# Patient Record
Sex: Male | Born: 1960 | Race: White | Hispanic: No | Marital: Married | Smoking: Never smoker
Health system: Southern US, Community
[De-identification: ages and names within clinical notes are randomized; demographics above are authoritative.]

## PROBLEM LIST (undated history)

## (undated) DIAGNOSIS — M8580 Other specified disorders of bone density and structure, unspecified site: Secondary | ICD-10-CM

## (undated) DIAGNOSIS — E119 Type 2 diabetes mellitus without complications: Secondary | ICD-10-CM

## (undated) DIAGNOSIS — G4730 Sleep apnea, unspecified: Secondary | ICD-10-CM

## (undated) DIAGNOSIS — G459 Transient cerebral ischemic attack, unspecified: Secondary | ICD-10-CM

## (undated) HISTORY — PX: OTHER SURGICAL HISTORY: SHX169

---

## 2010-07-16 ENCOUNTER — Ambulatory Visit: Payer: Self-pay | Admitting: Family Medicine

## 2012-04-15 ENCOUNTER — Ambulatory Visit: Payer: Self-pay | Admitting: Otolaryngology

## 2013-08-17 ENCOUNTER — Ambulatory Visit: Payer: Self-pay | Admitting: Family Medicine

## 2015-11-05 NOTE — OR Nursing (Signed)
Patient seen for anesthesia consult by Dr Gildardo Griffes on 09/23/15 and [facility] notified today

## 2015-11-09 ENCOUNTER — Ambulatory Visit
Admission: RE | Admit: 2015-11-09 | Discharge: 2015-11-09 | Disposition: A | Discharge status: Home / Self Care | Payer: 59 | Source: Ambulatory Visit | Attending: Gastroenterology | Admitting: Gastroenterology

## 2015-11-09 ENCOUNTER — Ambulatory Visit: Payer: 59 | Admitting: Anesthesiology

## 2015-11-09 ENCOUNTER — Encounter
Admission: RE | Disposition: A | Discharge status: Home / Self Care | Payer: Self-pay | Source: Ambulatory Visit | Attending: Gastroenterology

## 2015-11-09 ENCOUNTER — Encounter: Payer: Self-pay | Admitting: *Deleted

## 2015-11-09 DIAGNOSIS — D122 Benign neoplasm of ascending colon: Secondary | ICD-10-CM | POA: Diagnosis not present

## 2015-11-09 DIAGNOSIS — Z1211 Encounter for screening for malignant neoplasm of colon: Secondary | ICD-10-CM | POA: Diagnosis present

## 2015-11-09 DIAGNOSIS — Z7982 Long term (current) use of aspirin: Secondary | ICD-10-CM | POA: Diagnosis not present

## 2015-11-09 DIAGNOSIS — Z79899 Other long term (current) drug therapy: Secondary | ICD-10-CM | POA: Diagnosis not present

## 2015-11-09 DIAGNOSIS — Z794 Long term (current) use of insulin: Secondary | ICD-10-CM | POA: Insufficient documentation

## 2015-11-09 DIAGNOSIS — E119 Type 2 diabetes mellitus without complications: Secondary | ICD-10-CM | POA: Diagnosis not present

## 2015-11-09 DIAGNOSIS — G4730 Sleep apnea, unspecified: Secondary | ICD-10-CM | POA: Insufficient documentation

## 2015-11-09 DIAGNOSIS — Z8673 Personal history of transient ischemic attack (TIA), and cerebral infarction without residual deficits: Secondary | ICD-10-CM | POA: Insufficient documentation

## 2015-11-09 DIAGNOSIS — Z942 Lung transplant status: Secondary | ICD-10-CM | POA: Diagnosis not present

## 2015-11-09 DIAGNOSIS — K640 First degree hemorrhoids: Secondary | ICD-10-CM | POA: Diagnosis not present

## 2015-11-09 HISTORY — DX: Other specified disorders of bone density and structure, unspecified site: M85.80

## 2015-11-09 HISTORY — DX: Sleep apnea, unspecified: G47.30

## 2015-11-09 HISTORY — DX: Type 2 diabetes mellitus without complications: E11.9

## 2015-11-09 HISTORY — DX: Cystic fibrosis, unspecified: E84.9

## 2015-11-09 HISTORY — PX: COLONOSCOPY: SHX5424

## 2015-11-09 HISTORY — DX: Transient cerebral ischemic attack, unspecified: G45.9

## 2015-11-09 LAB — GLUCOSE, CAPILLARY
GLUCOSE-CAPILLARY: 66 mg/dL (ref 65–99)
GLUCOSE-CAPILLARY: 75 mg/dL (ref 65–99)

## 2015-11-09 SURGERY — COLONOSCOPY
Anesthesia: General

## 2015-11-09 MED ORDER — FENTANYL CITRATE (PF) 100 MCG/2ML IJ SOLN
INTRAMUSCULAR | Status: DC | PRN
  Administered 2015-11-09: 50 ug via INTRAVENOUS

## 2015-11-09 MED ORDER — PROPOFOL 500 MG/50ML IV EMUL
INTRAVENOUS | Status: DC | PRN
  Administered 2015-11-09: 125 ug/kg/min via INTRAVENOUS

## 2015-11-09 MED ORDER — EPHEDRINE SULFATE 50 MG/ML IJ SOLN
INTRAMUSCULAR | Status: DC | PRN
  Administered 2015-11-09 (×2): 5 mg via INTRAVENOUS

## 2015-11-09 MED ORDER — SODIUM CHLORIDE 0.9 % IV SOLN
INTRAVENOUS | Status: DC
  Administered 2015-11-09: 08:00:00 via INTRAVENOUS

## 2015-11-09 MED ORDER — PROPOFOL 10 MG/ML IV BOLUS
INTRAVENOUS | Status: DC | PRN
  Administered 2015-11-09: 50 mg via INTRAVENOUS

## 2015-11-09 NOTE — Transfer of Care (Signed)
Immediate Anesthesia Transfer of Care Note  Patient: Terrence Brown  Procedure(s) Performed: Procedure(s) with comments: COLONOSCOPY (N/A) -  Per office, patient HAS TO be 1st case - IDDM  Patient Location: PACU  Anesthesia Type:General  Level of Consciousness: awake, alert , oriented and patient cooperative  Airway & Oxygen Therapy: Patient Spontanous Breathing  Post-op Assessment: Report given to RN, Post -op Vital signs reviewed and stable and Patient moving all extremities  Post vital signs: Reviewed and stable  Last Vitals:  Filed Vitals:   11/09/15 0844 11/09/15 0848  BP: 108/47 108/47  Pulse: 87 82  Temp: 36.1 C 35.7 C  Resp: 18 15    Complications: No apparent anesthesia complications

## 2015-11-09 NOTE — Op Note (Signed)
[facility] Patient Name: Terrence Brown Procedure Date: 11/09/2015 8:11 AM MRN: [id number] Account #: [id number] Date of Birth: 08/22/1961 Admit Type: Outpatient Age: 54 Room: [facility] ENDO ROOM 3 Gender: Male Note Status: Finalized Procedure:         Colonoscopy Indications:       Screening for colorectal malignant neoplasm, Last                     colonoscopy: 2010 Patient Profile:   This is a 54 year old male. Providers:         Gerrit Heck. Rayann Heman, MD Referring MD:      Otho Bellows. Frederico Hamman, MD (Referring MD) Medicines:         Propofol per Anesthesia Complications:     No immediate complications. Procedure:         Pre-Anesthesia Assessment:                    - Prior to the procedure, a History and Physical was                     performed, and patient medications, allergies and                     sensitivities were reviewed. The patient's tolerance of                     previous anesthesia was reviewed.                    After obtaining informed consent, the colonoscope was                     passed under direct vision. Throughout the procedure, the                     patient's blood pressure, pulse, and oxygen saturations                     were monitored continuously. The Colonoscope was                     introduced through the anus and advanced to the the cecum,                     identified by appendiceal orifice and ileocecal valve. The                     colonoscopy was performed without difficulty. The patient                     tolerated the procedure well. The quality of the bowel                     preparation was good. Findings:      The perianal and digital rectal examinations were normal.      A 3 mm polyp was found in the ascending colon. The polyp was sessile.       The polyp was removed with a jumbo cold forceps. Resection and retrieval       were complete.      Internal hemorrhoids were found during retroflexion.  The hemorrhoids       were Grade I (internal hemorrhoids that do not prolapse).      The exam was otherwise without abnormality.  Impression:        - One 3 mm polyp in the ascending colon. Resected and                     retrieved.                    - Internal hemorrhoids.                    - The examination was otherwise normal. Recommendation:    - Observe patient in GI recovery unit.                    - High fiber diet.                    - Continue present medications.                    - Await pathology results.                    - Repeat colonoscopy for surveillance based on pathology                     results.                    - Return to referring physician.                    - The findings and recommendations were discussed with the                     patient.                    - The findings and recommendations were discussed with the                     patient's family. Procedure Code(s): --- Professional ---                    [phone], Colonoscopy, flexible; with biopsy, single or                     multiple Diagnosis Code(s): --- Professional ---                    Z12.11, Encounter for screening for malignant neoplasm of                     colon                    D12.2, Benign neoplasm of ascending colon                    K64.0, First degree hemorrhoids CPT copyright 2014 [employer] Association. All rights reserved. The codes documented in this report are preliminary and upon coder review may  be revised to meet current compliance requirements. Mellody Life, MD 11/09/2015 8:43:32 AM This report has been signed electronically. Number of Addenda: 0 Note Initiated On: 11/09/2015 8:11 AM Scope Withdrawal Time: 0 hours 18 minutes 16 seconds  Total Procedure Duration: 0 hours 23 minutes 47 seconds       [facility]

## 2015-11-09 NOTE — Discharge Instructions (Signed)

## 2015-11-09 NOTE — Anesthesia Postprocedure Evaluation (Signed)
Anesthesia Post Note  Patient: Terrence Brown  Procedure(s) Performed: Procedure(s) (LRB): COLONOSCOPY (N/A)  Patient location during evaluation: Endoscopy Anesthesia Type: General Level of consciousness: awake and alert Pain management: pain level controlled Vital Signs Assessment: post-procedure vital signs reviewed and stable Respiratory status: spontaneous breathing, nonlabored ventilation, respiratory function stable and patient connected to nasal cannula oxygen Cardiovascular status: blood pressure returned to baseline and stable Postop Assessment: no signs of nausea or vomiting Anesthetic complications: no    Last Vitals:  Filed Vitals:   11/09/15 0900 11/09/15 0914  BP: 115/77 111/62  Pulse: 80 77  Temp:    Resp: 15 14    Last Pain: There were no vitals filed for this visit.               Precious Haws [place]

## 2015-11-09 NOTE — H&P (Signed)
Primary Care Physician:  Laurell Josephs, MD  Pre-Procedure History & Physical: HPI:  Terrence Brown is a 54 y.o. male is here for an colonoscopy.   Past Medical History  Diagnosis Date  . Diabetes mellitus without complication ([place])   . Cystic fibrosis ([place])   . Osteopenia   . Sleep apnea   . TIA (transient ischemic attack)     Past Surgical History  Procedure Laterality Date  . Lung transplant Bilateral   . Knee arthoscopy Right     Prior to Admission medications   Medication Sig Start Date End Date Taking? Authorizing Provider  alendronate (FOSAMAX) 70 MG tablet Take 70 mg by mouth once a week. Take with a full glass of water on an empty stomach.   Yes Historical Provider, MD  aspirin EC 81 MG tablet Take 81 mg by mouth daily.   Yes Historical Provider, MD  atenolol (TENORMIN) 25 MG tablet Take 25 mg by mouth daily.   Yes Historical Provider, MD  azaTHIOprine (IMURAN) 50 MG tablet Take 50 mg by mouth daily.   Yes Historical Provider, MD  calcium carbonate (TUMS - DOSED IN MG ELEMENTAL CALCIUM) 500 MG chewable tablet Chew 1 tablet by mouth daily.   Yes Historical Provider, MD  ferrous sulfate 325 (65 FE) MG tablet Take 325 mg by mouth daily with breakfast.   Yes Historical Provider, MD  insulin aspart (NOVOLOG) 100 UNIT/ML injection Inject into the skin 3 (three) times daily before meals.   Yes Historical Provider, MD  insulin lispro (HUMALOG) 100 UNIT/ML injection Inject into the skin 3 (three) times daily before meals.   Yes Historical Provider, MD  insulin NPH Human (HUMULIN N,NOVOLIN N) 100 UNIT/ML injection Inject 40 Units into the skin daily before breakfast.   Yes Historical Provider, MD  insulin regular (NOVOLIN R,HUMULIN R) 100 units/mL injection Inject 0-10 Units into the skin 3 (three) times daily before meals.   Yes Historical Provider, MD  lipase/protease/amylase (CREON) 12000 UNITS CPEP capsule Take 12,000 Units by mouth.   Yes Historical Provider, MD  magnesium  oxide (MAG-OX) 400 MG tablet Take 400 mg by mouth daily.   Yes Historical Provider, MD  Multiple Vitamin (MULTIVITAMIN) capsule Take 1 capsule by mouth daily.   Yes Historical Provider, MD  nystatin (MYCOSTATIN) 100000 UNITS/ML SUSP Take 0.5 mLs by mouth every 6 (six) hours.   Yes Historical Provider, MD  predniSONE (DELTASONE) 10 MG tablet Take 10 mg by mouth daily with breakfast.   Yes Historical Provider, MD  tacrolimus (PROGRAF) 1 MG capsule Take 1 mg by mouth 2 (two) times daily.   Yes Historical Provider, MD  traMADol (ULTRAM) 50 MG tablet Take 50 mg by mouth every 6 (six) hours as needed.   Yes Historical Provider, MD  tretinoin (RETIN-A) 0.01 % gel Apply topically at bedtime.   Yes Historical Provider, MD  zolpidem (AMBIEN) 5 MG tablet Take 5 mg by mouth at bedtime as needed for sleep.   Yes Historical Provider, MD    Allergies as of 09/21/2015  . (Not on File)    History reviewed. No pertinent family history.  Social History   Social History  . Marital Status: Married    Spouse Name: N/A  . Number of Children: N/A  . Years of Education: N/A   Occupational History  . Not on file.   Social History Main Topics  . Smoking status: Never Smoker   . Smokeless tobacco: Never Used  . Alcohol Use: No  .  Drug Use: No  . Sexual Activity: Not on file   Other Topics Concern  . Not on file   Social History Narrative     Physical Exam: BP 137/83 mmHg  Pulse 75  Temp(Src) 97.8 F (36.6 C) (Tympanic)  Resp 14  Ht 5\' 7"  (1.702 m)  Wt 72.576 kg (160 lb)  BMI 25.05 kg/m2  SpO2 100% General:   Alert,  pleasant and cooperative in NAD Head:  Normocephalic and atraumatic. Neck:  Supple; no masses or thyromegaly. Lungs:  Clear throughout to auscultation.    Heart:  Regular rate and rhythm. Abdomen:  Soft, nontender and nondistended. Normal bowel sounds, without guarding, and without rebound.   Neurologic:  Alert and  oriented x4;  grossly normal  neurologically.  Impression/Plan: NYAIRE AMJAD is here for an colonoscopy to be performed for screening  Risks, benefits, limitations, and alternatives regarding  colonoscopy have been reviewed with the patient.  Questions have been answered.  All parties agreeable.   Josefine Class, MD  11/09/2015, 8:10 AM

## 2015-11-09 NOTE — Anesthesia Preprocedure Evaluation (Signed)
Anesthesia Evaluation  Patient identified by MRN, date of birth, ID band Patient awake    Reviewed: Allergy & Precautions, H&P , NPO status , Patient's Chart, lab work & pertinent test results  Airway Mallampati: II  TM Distance: >3 FB Neck ROM: full    Dental no notable dental hx. (+) Teeth Intact   Pulmonary sleep apnea ,    Pulmonary exam normal breath sounds clear to auscultation       Cardiovascular Exercise Tolerance: Good Normal cardiovascular exam Rhythm:regular Rate:Normal     Neuro/Psych TIACVA, Residual Symptoms negative psych ROS   GI/Hepatic negative GI ROS, Neg liver ROS,   Endo/Other  diabetes, Well Controlled, Type 2, Insulin Dependent  Renal/GU negative Renal ROS  negative genitourinary   Musculoskeletal   Abdominal   Peds  Hematology negative hematology ROS (+)   Anesthesia Other Findings Past Medical History:   Diabetes mellitus without complication (HCC)                 Cystic fibrosis (HCC)                                        Osteopenia                                                   Sleep apnea                                                  TIA (transient ischemic attack)                             Past Surgical History:   lung transplant                                 Bilateral              knee arthoscopy                                 Right             BMI    Body Mass Index   25.05 kg/m 2      Reproductive/Obstetrics negative OB ROS                             Anesthesia Physical Anesthesia Plan  ASA: IV  Anesthesia Plan: General   Post-op Pain Management:    Induction:   Airway Management Planned:   Additional Equipment:   Intra-op Plan:   Post-operative Plan:   Informed Consent: I have reviewed the patients History and Physical, chart, labs and discussed the procedure including the risks, benefits and alternatives for the  proposed anesthesia with the patient or authorized representative who has indicated his/her understanding and acceptance.   Dental Advisory Given  Plan Discussed with: Anesthesiologist, CRNA and Surgeon  Anesthesia Plan Comments:  Anesthesia Quick Evaluation  

## 2015-11-10 ENCOUNTER — Encounter: Payer: Self-pay | Admitting: Gastroenterology

## 2015-11-12 LAB — SURGICAL PATHOLOGY

## 2016-08-08 ENCOUNTER — Emergency Department
Admission: EM | Admit: 2016-08-08 | Discharge: 2016-08-08 | Disposition: A | Discharge status: Home / Self Care | Payer: 59 | Attending: Emergency Medicine | Admitting: Emergency Medicine

## 2016-08-08 DIAGNOSIS — Y92000 Kitchen of unspecified non-institutional (private) residence as  the place of occurrence of the external cause: Secondary | ICD-10-CM | POA: Insufficient documentation

## 2016-08-08 DIAGNOSIS — Z794 Long term (current) use of insulin: Secondary | ICD-10-CM | POA: Diagnosis not present

## 2016-08-08 DIAGNOSIS — Z7982 Long term (current) use of aspirin: Secondary | ICD-10-CM | POA: Insufficient documentation

## 2016-08-08 DIAGNOSIS — E119 Type 2 diabetes mellitus without complications: Secondary | ICD-10-CM | POA: Diagnosis not present

## 2016-08-08 DIAGNOSIS — Z23 Encounter for immunization: Secondary | ICD-10-CM | POA: Diagnosis not present

## 2016-08-08 DIAGNOSIS — Y999 Unspecified external cause status: Secondary | ICD-10-CM | POA: Diagnosis not present

## 2016-08-08 DIAGNOSIS — W01190A Fall on same level from slipping, tripping and stumbling with subsequent striking against furniture, initial encounter: Secondary | ICD-10-CM | POA: Diagnosis not present

## 2016-08-08 DIAGNOSIS — S01411A Laceration without foreign body of right cheek and temporomandibular area, initial encounter: Secondary | ICD-10-CM | POA: Insufficient documentation

## 2016-08-08 DIAGNOSIS — Y939 Activity, unspecified: Secondary | ICD-10-CM | POA: Insufficient documentation

## 2016-08-08 DIAGNOSIS — S0181XA Laceration without foreign body of other part of head, initial encounter: Secondary | ICD-10-CM

## 2016-08-08 MED ORDER — LIDOCAINE-EPINEPHRINE (PF) 1 %-1:200000 IJ SOLN
10.0000 mL | Freq: Once | INTRAMUSCULAR | Status: DC
  Filled 2016-08-08: qty 30

## 2016-08-08 MED ORDER — TETANUS-DIPHTH-ACELL PERTUSSIS 5-2.5-18.5 LF-MCG/0.5 IM SUSP
0.5000 mL | Freq: Once | INTRAMUSCULAR | Status: AC
  Administered 2016-08-08: 0.5 mL via INTRAMUSCULAR

## 2016-08-08 MED ORDER — CEPHALEXIN 500 MG PO CAPS: 500.0000 mg | ORAL_CAPSULE | Freq: Three times a day (TID) | ORAL | 0 refills | Status: DC

## 2016-08-08 NOTE — ED Triage Notes (Signed)
Pt reports to ED w/ c/o mechanical fall.  Pt denies LOC or dizziness.  Pt sts he hit side of face (r) on table. Pt has 1" lac to R cheekbone.  Bleeding controlled, edges of wound approximate.  Pt alert, oriented, NAD.

## 2016-08-08 NOTE — ED Provider Notes (Signed)
[facility] Emergency Department Provider Note  ____________________________________________  Time seen: Approximately 10:49 PM  I have reviewed the triage vital signs and the nursing notes.   HISTORY  Chief Complaint Facial Laceration    HPI Terrence Brown is a 55 y.o. male , NAD, presents to the emergency department with laceration to the right cheek. States he was at home, slipped in his kitchen and hit his right cheek on the edge of a counter. States the fall was mechanical and denies any changes in vision, chest pain, shortness breath, LOC or dizziness. He states he is uncertain of last tetanus vaccine.   Past Medical History:  Diagnosis Date  . Cystic fibrosis ([place])   . Diabetes mellitus without complication ([place])   . Osteopenia   . Sleep apnea   . TIA (transient ischemic attack)     There are no active problems to display for this patient.   Past Surgical History:  Procedure Laterality Date  . COLONOSCOPY N/A 11/09/2015   Procedure: COLONOSCOPY;  Surgeon: Josefine Class, MD;  Location: [facility] ENDOSCOPY;  Service: Endoscopy;  Laterality: N/A;   Per office, patient HAS TO be 1st case - IDDM  . knee arthoscopy Right   . lung transplant Bilateral     Prior to Admission medications   Medication Sig Start Date End Date Taking? Authorizing Provider  alendronate (FOSAMAX) 70 MG tablet Take 70 mg by mouth once a week. Take with a full glass of water on an empty stomach.    Historical Provider, MD  aspirin EC 81 MG tablet Take 81 mg by mouth daily.    Historical Provider, MD  atenolol (TENORMIN) 25 MG tablet Take 25 mg by mouth daily.    Historical Provider, MD  azaTHIOprine (IMURAN) 50 MG tablet Take 50 mg by mouth daily.    Historical Provider, MD  calcium carbonate (TUMS - DOSED IN MG ELEMENTAL CALCIUM) 500 MG chewable tablet Chew 1 tablet by mouth daily.    Historical Provider, MD  cephALEXin (KEFLEX) 500 MG capsule Take 1 capsule (500 mg  total) by mouth 3 (three) times daily. 08/08/16   Ladeana Laplant L Jaken Fregia, PA-C  ferrous sulfate 325 (65 FE) MG tablet Take 325 mg by mouth daily with breakfast.    Historical Provider, MD  insulin aspart (NOVOLOG) 100 UNIT/ML injection Inject into the skin 3 (three) times daily before meals.    Historical Provider, MD  insulin lispro (HUMALOG) 100 UNIT/ML injection Inject into the skin 3 (three) times daily before meals.    Historical Provider, MD  insulin NPH Human (HUMULIN N,NOVOLIN N) 100 UNIT/ML injection Inject 40 Units into the skin daily before breakfast.    Historical Provider, MD  insulin regular (NOVOLIN R,HUMULIN R) 100 units/mL injection Inject 0-10 Units into the skin 3 (three) times daily before meals.    Historical Provider, MD  lipase/protease/amylase (CREON) 12000 UNITS CPEP capsule Take 12,000 Units by mouth.    Historical Provider, MD  magnesium oxide (MAG-OX) 400 MG tablet Take 400 mg by mouth daily.    Historical Provider, MD  Multiple Vitamin (MULTIVITAMIN) capsule Take 1 capsule by mouth daily.    Historical Provider, MD  nystatin (MYCOSTATIN) 100000 UNITS/ML SUSP Take 0.5 mLs by mouth every 6 (six) hours.    Historical Provider, MD  predniSONE (DELTASONE) 10 MG tablet Take 10 mg by mouth daily with breakfast.    Historical Provider, MD  tacrolimus (PROGRAF) 1 MG capsule Take 1 mg by mouth 2 (two)  times daily.    Historical Provider, MD  traMADol (ULTRAM) 50 MG tablet Take 50 mg by mouth every 6 (six) hours as needed.    Historical Provider, MD  tretinoin (RETIN-A) 0.01 % gel Apply topically at bedtime.    Historical Provider, MD  zolpidem (AMBIEN) 5 MG tablet Take 5 mg by mouth at bedtime as needed for sleep.    Historical Provider, MD    Allergies Percocet [oxycodone-acetaminophen] and Sulfa antibiotics  No family history on file.  Social History Social History  Substance Use Topics  . Smoking status: Never Smoker  . Smokeless tobacco: Never Used  . Alcohol use No      Review of Systems  Constitutional: No fever/chills Eyes: No visual changes.  Cardiovascular: No chest pain. Respiratory: No shortness of breath. No wheezing.  Gastrointestinal: No abdominal pain.  No nausea, vomiting.. Musculoskeletal: Negative for Neck pain.  Skin: Positive laceration right cheek. No bruising or swelling. Neurological: Negative for headaches, focal weakness or numbness. No tingling, LOC, dizziness. 10-point ROS otherwise negative.  ____________________________________________   PHYSICAL EXAM:  VITAL SIGNS: ED Triage Vitals  Enc Vitals Group     BP 08/08/16 2159 (!) 145/71     Pulse Rate 08/08/16 2159 70     Resp 08/08/16 2159 20     Temp 08/08/16 2159 98.5 F (36.9 C)     Temp Source 08/08/16 2159 Oral     SpO2 08/08/16 2159 97 %     Weight 08/08/16 2159 160 lb (72.6 kg)     Height 08/08/16 2159 5\' 6"  (1.676 m)     Head Circumference --      Peak Flow --      Pain Score 08/08/16 2201 5     Pain Loc --      Pain Edu? --      Excl. in [place]? --      Constitutional: Alert and oriented. Well appearing and in no acute distress. Eyes: Conjunctivae are normal. PERRLA. EOMI without pain.  Head: Normocephalic. Tenderness to palpation about the right cheek. Neck: Supple with full range of motion Hematological/Lymphatic/Immunilogical: No cervical lymphadenopathy. Cardiovascular:  Good peripheral circulation. Respiratory: Normal respiratory effort without tachypnea or retractions.  Neurologic:  Normal speech and language. No gross focal neurologic deficits are appreciated.  Skin:  2.5cm linear laceration noted to the right cheek with bleeding controlled. No surrounding bruising or swelling. Skin is warm, dry. No rash noted. Psychiatric: Mood and affect are normal. Speech and behavior are normal. Patient exhibits appropriate insight and judgement.   ____________________________________________    LABS  None ____________________________________________  EKG  None ____________________________________________  RADIOLOGY  None ____________________________________________    PROCEDURES  Procedure(s) performed: None   .Marland KitchenLaceration Repair Date/Time: 08/08/2016 11:20 PM Performed by: Natale Milch L Authorized by: Braxton Feathers   Consent:    Consent obtained:  Verbal   Consent given by:  Patient   Risks discussed:  Infection, pain and poor cosmetic result   Alternatives discussed:  No treatment Anesthesia (see MAR for exact dosages):    Anesthesia method:  Local infiltration   Local anesthetic:  Lidocaine 1% WITH epi Laceration details:    Location:  Face   Face location:  R cheek   Length (cm):  2.5   Depth (mm):  3 Repair type:    Repair type:  Simple Pre-procedure details:    Preparation:  Patient was prepped and draped in usual sterile fashion Exploration:    Hemostasis achieved with:  Direct pressure   Wound exploration: wound explored through full range of motion and entire depth of wound probed and visualized     Wound extent: no foreign bodies/material noted and no muscle damage noted     Contaminated: no   Treatment:    Area cleansed with:  Betadine   Amount of cleaning:  Standard   Irrigation solution:  Sterile saline   Irrigation volume:  100   Irrigation method:  Syringe   Foreign body removal: No foreign bodies.   Skin repair:    Repair method:  Sutures   Suture size:  5-0   Suture material:  Nylon   Suture technique:  Simple interrupted   Number of sutures:  6 Approximation:    Approximation:  Close   [place] border: well-aligned   Post-procedure details:    Dressing:  Non-adherent dressing   Patient tolerance of procedure:  Tolerated well, no immediate complications      Medications  lidocaine-EPINEPHrine (XYLOCAINE-EPINEPHrine) 1 %-1:200000 (PF) injection 10 mL (not administered)  Tdap (BOOSTRIX) injection 0.5 mL (0.5 mLs  Intramuscular Given 08/08/16 2303)     ____________________________________________   INITIAL IMPRESSION / ASSESSMENT AND PLAN / ED COURSE  Pertinent labs & imaging results that were available during my care of the patient were reviewed by me and considered in my medical decision making (see chart for details).  Clinical Course    Patient's diagnosis is consistent with Facial laceration and need for tetanus due to fall. Patient will be discharged home with prescriptions for Keflex to take as directed. Patient is to keep wound clean and dry over the next 48 hours and then may cleanse per usual regimen. Patient is to follow up with his primary care provider in 2-3 days for wound recheck.  Patient is to follow-up with his primary care provider or an outpatient urgent care clinic in 5-7 days for suture removal. Patient is given ED precautions to return to the ED for any worsening or new symptoms.    ____________________________________________  FINAL CLINICAL IMPRESSION(S) / ED DIAGNOSES  Final diagnoses:  Facial laceration, initial encounter  Need for tetanus booster      NEW MEDICATIONS STARTED DURING THIS VISIT:  New Prescriptions   CEPHALEXIN (KEFLEX) 500 MG CAPSULE    Take 1 capsule (500 mg total) by mouth 3 (three) times daily.         Braxton Feathers, PA-C 08/08/16 2344    Lisa Roca, MD 08/09/16 0010

## 2017-01-27 DIAGNOSIS — Z942 Lung transplant status: Secondary | ICD-10-CM | POA: Diagnosis not present

## 2017-01-27 DIAGNOSIS — G4733 Obstructive sleep apnea (adult) (pediatric): Secondary | ICD-10-CM | POA: Diagnosis not present

## 2017-02-03 DIAGNOSIS — G4733 Obstructive sleep apnea (adult) (pediatric): Secondary | ICD-10-CM | POA: Diagnosis not present

## 2017-02-23 ENCOUNTER — Encounter: Payer: 59 | Attending: Specialist | Admitting: Respiratory Therapy

## 2017-02-24 NOTE — Patient Instructions (Signed)
Patient Instructions  Patient Details  Name: Terrence MCCLANAHAN MRN: [id number] Date of Birth: Jan 31, 1961 Referring Provider:  Erby Pian, MD  Below are the personal goals you chose as well as exercise and nutrition goals. Our goal is to help you keep on track towards obtaining and maintaining your goals. We will be discussing your progress on these goals with you throughout the program.  Initial Exercise Prescription:     Initial Exercise Prescription - 02/23/17 1600      Date of Initial Exercise RX and Referring Provider   Date 02/23/17   Referring Provider Raul Del     Treadmill   Minutes 15     Recumbant Bike   Level 1   RPM 50   Minutes 15     NuStep   Level 3   Minutes 15     Prescription Details   Frequency (times per week) 3   Duration Progress to 30 minutes of continuous aerobic without signs/symptoms of physical distress     Intensity   THRR 40-80% of Max Heartrate 104-145   Ratings of Perceived Exertion 11-15   Perceived Dyspnea 0-4     Resistance Training   Training Prescription Yes   Weight 3   Reps 10-15      Exercise Goals: Frequency: Be able to perform aerobic exercise three times per week working toward 3-5 days per week.  Intensity: Work with a perceived exertion of 11 (fairly light) - 15 (hard) as tolerated. Follow your new exercise prescription and watch for changes in prescription as you progress with the program. Changes will be reviewed with you when they are made.  Duration: You should be able to do 30 minutes of continuous aerobic exercise in addition to a 5 minute warm-up and a 5 minute cool-down routine.  Nutrition Goals: Your personal nutrition goals will be established when you do your nutrition analysis with the dietician.  The following are nutrition guidelines to follow: Cholesterol < 200mg /day Sodium < 1500mg /day Fiber: Men over 50 yrs - 30 grams per day  Personal Goals:     Personal Goals and Risk Factors at  Admission - 02/23/17 1545      Core Components/Risk Factors/Patient Goals on Admission    Weight Management Yes   Intervention --  Wife cooks very healthy - lots of salads and vegetables   Admit Weight 171 lb 3.2 oz (77.7 kg)   Goal Weight: Short Term 166 lb (75.3 kg)   Goal Weight: Long Term 140 lb (63.5 kg)   Expected Outcomes Short Term: Continue to assess and modify interventions until short term weight is achieved;Long Term: Adherence to nutrition and physical activity/exercise program aimed toward attainment of established weight goal;Weight Maintenance: Understanding of the daily nutrition guidelines, which includes 25-35% calories from fat, 7% or less cal from saturated fats, less than 200mg  cholesterol, less than 1.5gm of sodium, & 5 or more servings of fruits and vegetables daily;Weight Loss: Understanding of general recommendations for a balanced deficit meal plan, which promotes 1-2 lb weight loss per week and includes a negative energy balance of [phone] kcal/d;Understanding recommendations for meals to include 15-35% energy as protein, 25-35% energy from fat, 35-60% energy from carbohydrates, less than 200mg  of dietary cholesterol, 20-35 gm of total fiber daily   Improve shortness of breath with ADL's Yes   Intervention Provide education, individualized exercise plan and daily activity instruction to help decrease symptoms of SOB with activities of daily living.   Expected Outcomes Short Term: Achieves  a reduction of symptoms when performing activities of daily living.   Develop more efficient breathing techniques such as purse lipped breathing and diaphragmatic breathing; and practicing self-pacing with activity Yes   Intervention Provide education, demonstration and support about specific breathing techniuqes utilized for more efficient breathing. Include techniques such as pursed lipped breathing, diaphragmatic breathing and self-pacing activity.   Expected Outcomes Short Term:  Participant will be able to demonstrate and use breathing techniques as needed throughout daily activities.   Increase knowledge of respiratory medications and ability to use respiratory devices properly  Yes   Intervention Provide education and demonstration as needed of appropriate use of medications, inhalers, and oxygen therapy.  Good understanding of CPAP; Uses suto settings.    Expected Outcomes Short Term: Achieves understanding of medications use. Understands that oxygen is a medication prescribed by physician. Demonstrates appropriate use of inhaler and oxygen therapy.   Lipids Yes   Intervention Provide education and support for participant on nutrition & aerobic/resistive exercise along with prescribed medications to achieve LDL 70mg , HDL >40mg .   Expected Outcomes Short Term: Participant states understanding of desired cholesterol values and is compliant with medications prescribed. Participant is following exercise prescription and nutrition guidelines.;Long Term: Cholesterol controlled with medications as prescribed, with individualized exercise RX and with personalized nutrition plan. Value goals: LDL < 70mg , HDL > 40 mg.      Tobacco Use Initial Evaluation: History  Smoking Status   Never Smoker  Smokeless Tobacco   Never Used    Copy of goals given to participant.

## 2017-02-24 NOTE — Progress Notes (Signed)
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 Take with a full glass of water on an empty stomach., Disp: , Rfl:    aspirin EC 81 MG tablet, Take 81 mg by mouth daily., Disp: , Rfl:    atenolol (TENORMIN) 25 MG tablet, Take 25 mg by mouth daily., Disp: , Rfl:    azaTHIOprine (IMURAN) 50 MG tablet, Take 50 mg by mouth daily., Disp: , Rfl:    calcium carbonate (TUMS - DOSED IN MG ELEMENTAL CALCIUM) 500 MG chewable tablet, Chew 1 tablet by mouth daily., Disp: , Rfl:    cephALEXin (KEFLEX) 500 MG capsule, Take 1 capsule (500 mg total) by mouth 3 (three) times daily., Disp: 21 capsule, Rfl: 0   ferrous sulfate 325 (65 FE) MG tablet, Take 325 mg by mouth daily with breakfast., Disp: , Rfl:    insulin aspart (NOVOLOG) 100 UNIT/ML injection, Inject into the skin 3 (three) times daily before meals., Disp: , Rfl:    insulin lispro (HUMALOG) 100 UNIT/ML injection, Inject into the skin 3 (three) times daily before meals., Disp: , Rfl:    insulin NPH Human (HUMULIN N,NOVOLIN N) 100 UNIT/ML injection, Inject 40 Units into the skin daily before breakfast., Disp: , Rfl:    insulin regular (NOVOLIN R,HUMULIN R) 100 units/mL injection, Inject 0-10 Units into the skin 3 (three) times daily before meals., Disp: , Rfl:    lipase/protease/amylase (CREON) 12000 UNITS CPEP capsule, Take 12,000 Units by mouth., Disp: , Rfl:    magnesium oxide (MAG-OX) 400 MG tablet, Take  400 mg by mouth daily., Disp: , Rfl:    Multiple Vitamin (MULTIVITAMIN) capsule, Take 1 capsule by mouth daily., Disp: , Rfl:    nystatin (MYCOSTATIN) 100000 UNITS/ML SUSP, Take 0.5 mLs by mouth every 6 (six) hours., Disp: , Rfl:    predniSONE (DELTASONE) 10 MG tablet, Take 10 mg by mouth daily with breakfast., Disp: , Rfl:    tacrolimus (PROGRAF) 1 MG capsule, Take 1 mg by mouth 2 (two) times daily., Disp: , Rfl:    traMADol (ULTRAM) 50 MG tablet, Take 50 mg by mouth every 6 (six) hours as needed., Disp: , Rfl:    tretinoin (RETIN-A) 0.01 % gel, Apply topically at bedtime., Disp: , Rfl:    zolpidem (AMBIEN) 5 MG tablet, Take 5 mg by mouth at bedtime as needed for sleep., Disp: , Rfl:   Past Medical History: Past Medical History:  Diagnosis Date   Cystic fibrosis ([place])    Diabetes mellitus without complication ([place])    Osteopenia    Sleep apnea    TIA (transient ischemic attack)     Tobacco Use: History  Smoking Status   Never Smoker  Smokeless Tobacco   Never Used    Labs: Recent Review Flowsheet Data    There is no flowsheet data to display.       ADL UCSD:     Pulmonary Assessment Scores  [place] Name 02/23/17 0402         ADL UCSD   ADL Phase Entry     SOB Score total 35     Rest 1     Walk 2     Stairs 4     Bath 1     Dress 2     Shop 2        Pulmonary Function Assessment:     Pulmonary Function Assessment - 02/24/17 0618      Pulmonary Function Tests   FVC% 80 %   FEV1% 93 %   FEV1/FVC Ratio 90.41     Breath   Bilateral Breath Sounds Clear   Shortness of Breath Yes;Limiting activity      Exercise Target Goals: Date: 02/23/17  Exercise Program Goal: Individual exercise prescription set with THRR, safety & activity barriers. Participant demonstrates ability to understand and report RPE using BORG scale, to self-measure pulse accurately, and to acknowledge the importance of the exercise prescription.  Exercise Prescription  Goal: Starting with aerobic activity 30 plus minutes a day, 3 days per week for initial exercise prescription. Provide home exercise prescription and guidelines that participant acknowledges understanding prior to discharge.  Activity Barriers & Risk Stratification:   6 Minute Walk:     6 Minute Walk    Row Name 02/23/17 1622         6 Minute Walk   Distance 1250 feet     Walk Time 6 minutes     # of Rest Breaks 0     MPH 2.36     METS 3.86     RPE 13     Perceived Dyspnea  2     VO2 Peak 13.5     Symptoms No     Resting HR 63 bpm     Resting BP 138/62     Max Ex. HR 102 bpm     Max Ex. BP 144/58       Interval HR   Baseline HR 63     3 Minute HR 97     6 Minute HR 102     Interval Heart Rate? Yes       Interval Oxygen   Interval Oxygen? Yes     Baseline Oxygen Saturation % 98 %     3 Minute Oxygen Saturation % 98 %     6 Minute Oxygen Saturation % 96 %       Oxygen Initial Assessment:   Oxygen Re-Evaluation:   Oxygen Discharge (Final Oxygen Re-Evaluation):   Initial Exercise Prescription:     Initial Exercise Prescription - 02/23/17 1600      Date of Initial Exercise RX and Referring Provider   Date 02/23/17   Referring Provider Raul Del     Treadmill   Minutes 15     Recumbant Bike   Level 1   RPM 50   Minutes 15     NuStep   Level 3   Minutes 15     Prescription Details   Frequency (times per week) 3   Duration Progress to 30 minutes of continuous aerobic without signs/symptoms of physical distress     Intensity   THRR 40-80% of Max Heartrate 104-145   Ratings of Perceived Exertion 11-15   Perceived Dyspnea 0-4     Resistance Training   Training Prescription Yes   Weight 3   Reps 10-15      Perform Capillary Blood Glucose checks as needed.  Exercise Prescription Changes:   Exercise Comments:   Exercise Goals and Review:   Exercise Goals Re-Evaluation :   Discharge Exercise Prescription (Final Exercise Prescription  Changes):   Nutrition:  Target Goals: Understanding of nutrition guidelines, daily intake of sodium 1500mg , cholesterol 200mg , calories 30% from fat and 7% or less from saturated fats, daily to have 5 or more servings of fruits and vegetables.  Biometrics:     Pre Biometrics - 02/23/17 1621      Pre Biometrics   Height 5' 7.25" (1.708 m)   Weight 171 lb 3.2 oz (77.7 kg)   Waist Circumference 40.25 inches   Hip Circumference 40.75 inches   Waist to Hip Ratio 0.99 %   BMI (Calculated) 26.7       Nutrition Therapy Plan and Nutrition Goals:   Nutrition Discharge: Rate Your Plate Scores:   Nutrition Goals Re-Evaluation:   Nutrition Goals Discharge (Final Nutrition Goals Re-Evaluation):   Psychosocial: Target Goals: Acknowledge presence or absence of significant depression and/or stress, maximize coping skills, provide positive support system. Participant is able to verbalize types and ability to use techniques and skills needed for reducing stress and depression.   Initial Review & Psychosocial Screening:     Initial Psych Review & Screening - 02/23/17 [place]? 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 Being very athletic and musical with piano and guitar, he is now unable to do these things , he loved doing. Over the years, he has worked through his anger and disappointment . Today he is excited about [facility] and exercising again and working on his balance. His wife has been very supportive.     Barriers   Psychosocial barriers to participate in program The patient should benefit from training in stress management and relaxation.     Screening Interventions   Interventions Yes;Encouraged to exercise;Program counselor consult   Expected Outcomes Short Term goal: Utilizing psychosocial counselor, staff and physician to assist  with identification of specific Stressors or current issues interfering with healing process. Setting desired goal for each stressor or current issue identified.;Long Term Goal: Stressors or current issues are controlled or eliminated.;Short Term goal: Identification and review with participant of any Quality of Life or Depression concerns found by scoring the questionnaire.      Quality of Life Scores:     Quality of Life - 02/23/17 1545      Quality of Life Scores   Health/Function Pre 20.19 %   Socioeconomic Pre 21 %   Psych/Spiritual Pre 19.86 %   Family Pre 21 %   GLOBAL Pre 20.34 %      PHQ-9: Recent Review Flowsheet Data    Depression screen [facility] 2/9 02/24/2017   Decreased Interest 0   Down, Depressed, Hopeless 0   PHQ - 2 Score 0   Altered sleeping 3   Tired, decreased energy 3   Change in appetite 0   Feeling bad or failure about yourself  0   Trouble concentrating 0   Moving slowly or fidgety/restless 0   Suicidal thoughts 0   PHQ-9 Score 6   Difficult doing work/chores Not difficult at all     Interpretation of Total Score  Total Score Depression Severity:  1-4 = Minimal depression, 5-9 = Mild depression, 10-14 = Moderate depression, 15-19 = Moderately severe depression,  20-27 = Severe depression   Psychosocial Evaluation and Intervention:   Psychosocial Re-Evaluation:   Psychosocial Discharge (Final Psychosocial Re-Evaluation):   Education: Education Goals: Education classes will be provided on a weekly basis, covering required topics. Participant will state understanding/return demonstration of topics presented.  Learning Barriers/Preferences:     Learning Barriers/Preferences - 02/24/17 0617      Learning Barriers/Preferences   Learning Barriers Exercise Concerns;None   Learning Preferences None      Education Topics: Initial Evaluation Education: - Verbal, written and demonstration of respiratory meds, RPE/PD scales, oximetry and breathing  techniques. Instruction on use of nebulizers and MDIs: cleaning and proper use, rinsing mouth with steroid doses and importance of monitoring MDI activations.   Pulmonary Rehab from 02/23/2017 in [facility] Cardiac and Pulmonary Rehab  Date  02/23/17  Educator  LB  Instruction Review Code  2- meets goals/outcomes      General Nutrition Guidelines/Fats and Fiber: -Group instruction provided by verbal, written material, models and posters to present the general guidelines for heart healthy nutrition. Gives an explanation and review of dietary fats and fiber.   Controlling Sodium/Reading Food Labels: -Group verbal and written material supporting the discussion of sodium use in heart healthy nutrition. Review and explanation with models, verbal and written materials for utilization of the food label.   Exercise Physiology & Risk Factors: - Group verbal and written instruction with models to review the exercise physiology of the cardiovascular system and associated critical values. Details cardiovascular disease risk factors and the goals associated with each risk factor.   Aerobic Exercise & Resistance Training: - Gives group verbal and written discussion on the health impact of inactivity. On the components of aerobic and resistive training programs and the benefits of this training and how to safely progress through these programs.   Flexibility, Balance, General Exercise Guidelines: - Provides group verbal and written instruction on the benefits of flexibility and balance training programs. Provides general exercise guidelines with specific guidelines to those with heart or lung disease. Demonstration and skill practice provided.   Stress Management: - Provides group verbal and written instruction about the health risks of elevated stress, cause of high stress, and healthy ways to reduce stress.   Depression: - Provides group verbal and written instruction on the correlation between  heart/lung disease and depressed mood, treatment options, and the stigmas associated with seeking treatment.   Exercise & Equipment Safety: - Individual verbal instruction and demonstration of equipment use and safety with use of the equipment.   Infection Prevention: - Provides verbal and written material to individual with discussion of infection control including proper hand washing and proper equipment cleaning during exercise session.   Pulmonary Rehab from 02/23/2017 in [facility] Cardiac and Pulmonary Rehab  Date  02/23/17  Educator  LB  Instruction Review Code  2- meets goals/outcomes      Falls Prevention: - Provides verbal and written material to individual with discussion of falls prevention and safety.   Pulmonary Rehab from 02/23/2017 in [facility] Cardiac and Pulmonary Rehab  Date  02/23/17  Educator  LB  Instruction Review Code  2- meets goals/outcomes      Diabetes: - Individual verbal and written instruction to review signs/symptoms of diabetes, desired ranges of glucose level fasting, after meals and with exercise. Advice that pre and post exercise glucose checks will be done for 3 sessions at entry of program.   Chronic Lung Diseases: - Group verbal and written instruction to review new updates, new respiratory  medications, new advancements in procedures and treatments. Provide informative websites and "800" numbers of self-education.   Lung Procedures: - Group verbal and written instruction to describe testing methods done to diagnose lung disease. Review the outcome of test results. Describe the treatment choices: Pulmonary Function Tests, ABGs and oximetry.   Energy Conservation: - Provide group verbal and written instruction for methods to conserve energy, plan and organize activities. Instruct on pacing techniques, use of adaptive equipment and posture/positioning to relieve shortness of breath.   Triggers: - Group verbal and written instruction to review types of  environmental controls: home humidity, furnaces, filters, dust mite/pet prevention, HEPA vacuums. To discuss weather changes, air quality and the benefits of nasal washing.   Exacerbations: - Group verbal and written instruction to provide: warning signs, infection symptoms, calling MD promptly, preventive modes, and value of vaccinations. Review: effective airway clearance, coughing and/or vibration techniques. Create an Sports administrator.   Oxygen: - Individual and group verbal and written instruction on oxygen therapy. Includes supplement oxygen, available portable oxygen systems, continuous and intermittent flow rates, oxygen safety, concentrators, and Medicare reimbursement for oxygen.   Respiratory Medications: - Group verbal and written instruction to review medications for lung disease. Drug class, frequency, complications, importance of spacers, rinsing mouth after steroid MDI's, and proper cleaning methods for nebulizers.   AED/CPR: - Group verbal and written instruction with the use of models to demonstrate the basic use of the AED with the basic ABC's of resuscitation.   Breathing Retraining: - Provides individuals verbal and written instruction on purpose, frequency, and proper technique of diaphragmatic breathing and pursed-lipped breathing. Applies individual practice skills.   Pulmonary Rehab from 02/23/2017 in [facility] Cardiac and Pulmonary Rehab  Date  02/23/17  Educator  LB  Instruction Review Code  2- meets goals/outcomes      Anatomy and Physiology of the Lungs: - Group verbal and written instruction with the use of models to provide basic lung anatomy and physiology related to function, structure and complications of lung disease.   Heart Failure: - Group verbal and written instruction on the basics of heart failure: signs/symptoms, treatments, explanation of ejection fraction, enlarged heart and cardiomyopathy.   Sleep Apnea: - Individual verbal and written  instruction to review Obstructive Sleep Apnea. Review of risk factors, methods for diagnosing and types of masks and machines for OSA.   Pulmonary Rehab from 02/23/2017 in [facility] Cardiac and Pulmonary Rehab  Date  02/23/17  Educator  LB  Instruction Review Code  2- meets goals/outcomes      Anxiety: - Provides group, verbal and written instruction on the correlation between heart/lung disease and anxiety, treatment options, and management of anxiety.   Relaxation: - Provides group, verbal and written instruction about the benefits of relaxation for patients with heart/lung disease. Also provides patients with examples of relaxation techniques.   Knowledge Questionnaire Score:     Knowledge Questionnaire Score - 02/24/17 0617      Knowledge Questionnaire Score   Pre Score 8/10       Core Components/Risk Factors/Patient Goals at Admission:     Personal Goals and Risk Factors at Admission - 02/23/17 1545      Core Components/Risk Factors/Patient Goals on Admission    Weight Management Yes   Intervention --  Wife cooks very healthy - lots of salads and vegetables   Admit Weight 171 lb 3.2 oz (77.7 kg)   Goal Weight: Short Term 166 lb (75.3 kg)   Goal Weight: Long Term 140  lb (63.5 kg)   Expected Outcomes Short Term: Continue to assess and modify interventions until short term weight is achieved;Long Term: Adherence to nutrition and physical activity/exercise program aimed toward attainment of established weight goal;Weight Maintenance: Understanding of the daily nutrition guidelines, which includes 25-35% calories from fat, 7% or less cal from saturated fats, less than 200mg  cholesterol, less than 1.5gm of sodium, & 5 or more servings of fruits and vegetables daily;Weight Loss: Understanding of general recommendations for a balanced deficit meal plan, which promotes 1-2 lb weight loss per week and includes a negative energy balance of [phone] kcal/d;Understanding recommendations for  meals to include 15-35% energy as protein, 25-35% energy from fat, 35-60% energy from carbohydrates, less than 200mg  of dietary cholesterol, 20-35 gm of total fiber daily   Improve shortness of breath with ADL's Yes   Intervention Provide education, individualized exercise plan and daily activity instruction to help decrease symptoms of SOB with activities of daily living.   Expected Outcomes Short Term: Achieves a reduction of symptoms when performing activities of daily living.   Develop more efficient breathing techniques such as purse lipped breathing and diaphragmatic breathing; and practicing self-pacing with activity Yes   Intervention Provide education, demonstration and support about specific breathing techniuqes utilized for more efficient breathing. Include techniques such as pursed lipped breathing, diaphragmatic breathing and self-pacing activity.   Expected Outcomes Short Term: Participant will be able to demonstrate and use breathing techniques as needed throughout daily activities.   Increase knowledge of respiratory medications and ability to use respiratory devices properly  Yes   Intervention Provide education and demonstration as needed of appropriate use of medications, inhalers, and oxygen therapy.  Good understanding of CPAP; Uses suto settings.    Expected Outcomes Short Term: Achieves understanding of medications use. Understands that oxygen is a medication prescribed by physician. Demonstrates appropriate use of inhaler and oxygen therapy.   Lipids Yes   Intervention Provide education and support for participant on nutrition & aerobic/resistive exercise along with prescribed medications to achieve LDL 70mg , HDL >40mg .   Expected Outcomes Short Term: Participant states understanding of desired cholesterol values and is compliant with medications prescribed. Participant is following exercise prescription and nutrition guidelines.;Long Term: Cholesterol controlled with  medications as prescribed, with individualized exercise RX and with personalized nutrition plan. Value goals: LDL < 70mg , HDL > 40 mg.      Core Components/Risk Factors/Patient Goals Review:    Core Components/Risk Factors/Patient Goals at Discharge (Final Review):    ITP Comments:   Comments: Mr Walz plans to start [facility] on 03/04/17 and attend 3 days/week.

## 2017-03-04 ENCOUNTER — Encounter: Payer: 59 | Attending: Specialist | Admitting: *Deleted

## 2017-03-04 NOTE — Progress Notes (Signed)
Daily Session Note  Patient Details  Name: Terrence Brown MRN: [id number] Date of Birth: 1961/01/29 Referring Provider:     Pulmonary Rehab from 02/23/2017 in [facility] Cardiac and Pulmonary Rehab  Referring Provider  Terrence Brown      Encounter Date: 03/04/2017  Check In:     Session Check In - 03/04/17 1134      Check-In   Location [facility]-Cardiac & Pulmonary Rehab   Staff Present Alberteen Sam, MA, ACSM RCEP, Exercise Physiologist;Laureen Owens Shark, BS, RRT, Respiratory Therapist;Carroll Enterkin, RN, BSN   Supervising physician immediately available to respond to emergencies [facility] immediately available ER MD   Physician(s) Drs. Lord and [place]   Medication changes reported     No   Fall or balance concerns reported    No   Warm-up and Cool-down Performed as group-led Location manager Performed Yes   VAD Patient? No     Pain Assessment   Currently in Pain? No/denies   Multiple Pain Sites No           Exercise Prescription Changes - 03/04/17 1400      Response to Exercise   Blood Pressure (Admit) 126/64   Blood Pressure (Exercise) 126/64   Blood Pressure (Exit) 118/64   Heart Rate (Admit) 72 bpm   Heart Rate (Exercise) 84 bpm   Heart Rate (Exit) 95 bpm   Oxygen Saturation (Admit) 100 %   Oxygen Saturation (Exercise) 96 %   Oxygen Saturation (Exit) 95 %   Rating of Perceived Exertion (Exercise) 11   Perceived Dyspnea (Exercise) 3   Symptoms none   Comments first full day of exercise   Duration Progress to 45 minutes of aerobic exercise without signs/symptoms of physical distress   Intensity THRR unchanged     Progression   Progression Continue to progress workloads to maintain intensity without signs/symptoms of physical distress.   Average METs 2.81     Resistance Training   Training Prescription Yes   Weight 3   Reps 10-15     Interval Training   Interval Training No     Treadmill   MPH 1.7   Grade 0.5   Minutes 15   METs 2.42     NuStep   Level 3   SPM 91   Minutes 15   METs 3.2      History  Smoking Status  . Never Smoker  Smokeless Tobacco  . Never Used    Goals Met:  Proper associated with RPD/PD & O2 Sat Exercise tolerated well Queuing for purse lip breathing Strength training completed today  Goals Unmet:  Not Applicable  Comments: First full day of exercise!  Patient was oriented to gym and equipment including functions, settings, policies, and procedures.  Patient's individual exercise prescription and treatment plan were reviewed.  All starting workloads were established based on the results of the 6 minute walk test done at initial orientation visit.  The plan for exercise progression was also introduced and progression will be customized based on patient's performance and goals.    Dr. Emily Filbert is Medical Director for Terrence Brown and [facility].

## 2017-03-05 LAB — GLUCOSE, CAPILLARY
GLUCOSE-CAPILLARY: 226 mg/dL — AB (ref 65–99)
GLUCOSE-CAPILLARY: 152 mg/dL — AB (ref 65–99)

## 2017-03-06 ENCOUNTER — Encounter: Payer: 59 | Admitting: *Deleted

## 2017-03-06 LAB — GLUCOSE, CAPILLARY
GLUCOSE-CAPILLARY: 206 mg/dL — AB (ref 65–99)
Glucose-Capillary: 116 mg/dL — ABNORMAL HIGH (ref 65–99)

## 2017-03-06 NOTE — Progress Notes (Signed)
Daily Session Note  Patient Details  Name: JA OHMAN MRN: [id number] Date of Birth: [birth year] Referring Provider:     Pulmonary Rehab from 02/23/2017 in [facility] Cardiac and Pulmonary Rehab  Referring Provider  Raul Del      Encounter Date: 03/06/2017  Check In:     Session Check In - 03/06/17 1148      Check-In   Location [facility]-Cardiac & Pulmonary Rehab   Staff Present Alberteen Sam, MA, ACSM RCEP, Exercise Physiologist;Other  Darel Hong, RN BSN   Supervising physician immediately available to respond to emergencies [facility] immediately available ER MD   Physician(s) Drs. Kinner and [place]   Medication changes reported     No   Fall or balance concerns reported    No   Tobacco Cessation No Change   Warm-up and Cool-down Performed as group-led instruction   Resistance Training Performed Yes   VAD Patient? No     Pain Assessment   Currently in Pain? No/denies   Multiple Pain Sites No         History  Smoking Status  . Never Smoker  Smokeless Tobacco  . Never Used    Goals Met:  Proper associated with RPD/PD & O2 Sat Independence with exercise equipment Using PLB without cueing & demonstrates good technique Exercise tolerated well Strength training completed today  Goals Unmet:  Not Applicable  Comments: Pt able to follow exercise prescription today without complaint.  Will continue to monitor for progression.    Dr. Emily Filbert is Medical Director for [place] and [facility].

## 2017-03-09 ENCOUNTER — Encounter: Payer: Self-pay | Admitting: Respiratory Therapy

## 2017-03-09 NOTE — Progress Notes (Signed)
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 Take with a full glass of water on an empty stomach., Disp: , Rfl:    aspirin EC 81 MG tablet, Take 81 mg by mouth daily., Disp: , Rfl:    atenolol (TENORMIN) 25 MG tablet, Take 25 mg by mouth daily., Disp: , Rfl:    azaTHIOprine (IMURAN) 50 MG tablet, Take 50 mg by mouth daily., Disp: , Rfl:    calcium carbonate (TUMS - DOSED IN MG ELEMENTAL CALCIUM) 500 MG chewable tablet, Chew 1 tablet by mouth daily., Disp: , Rfl:    cephALEXin (KEFLEX) 500 MG capsule, Take 1 capsule (500 mg total) by mouth 3 (three) times daily., Disp: 21 capsule, Rfl: 0   ferrous sulfate 325 (65 FE) MG tablet, Take 325 mg by mouth daily with breakfast., Disp: , Rfl:    insulin aspart (NOVOLOG) 100 UNIT/ML injection, Inject into the skin 3 (three) times daily before meals., Disp: , Rfl:    insulin lispro (HUMALOG) 100 UNIT/ML injection, Inject into the skin 3 (three) times daily before meals., Disp: , Rfl:    insulin NPH Human (HUMULIN N,NOVOLIN N) 100 UNIT/ML injection, Inject 40 Units into the skin daily before breakfast., Disp: , Rfl:    insulin regular (NOVOLIN R,HUMULIN R) 100 units/mL injection, Inject 0-10 Units into the skin 3 (three) times daily before meals., Disp: , Rfl:    lipase/protease/amylase (CREON) 12000 UNITS CPEP capsule, Take 12,000 Units by mouth., Disp: , Rfl:    magnesium oxide (MAG-OX) 400 MG tablet, Take  400 mg by mouth daily., Disp: , Rfl:    Multiple Vitamin (MULTIVITAMIN) capsule, Take 1 capsule by mouth daily., Disp: , Rfl:    nystatin (MYCOSTATIN) 100000 UNITS/ML SUSP, Take 0.5 mLs by mouth every 6 (six) hours., Disp: , Rfl:    predniSONE (DELTASONE) 10 MG tablet, Take 10 mg by mouth daily with breakfast., Disp: , Rfl:    tacrolimus (PROGRAF) 1 MG capsule, Take 1 mg by mouth 2 (two) times daily., Disp: , Rfl:    traMADol (ULTRAM) 50 MG tablet, Take 50 mg by mouth every 6 (six) hours as needed., Disp: , Rfl:    tretinoin (RETIN-A) 0.01 % gel, Apply topically at bedtime., Disp: , Rfl:    zolpidem (AMBIEN) 5 MG tablet, Take 5 mg by mouth at bedtime as needed for sleep., Disp: , Rfl:   Past Medical History: Past Medical History:  Diagnosis Date   Cystic fibrosis ([place])    Diabetes mellitus without complication ([place])    Osteopenia    Sleep apnea    TIA (transient ischemic attack)     Tobacco Use: History  Smoking Status   Never Smoker  Smokeless Tobacco   Never Used    Labs: Recent Review Flowsheet Data    There is no flowsheet data to display.       ADL UCSD:     Pulmonary Assessment Scores  [place] Name 02/23/17 0402         ADL UCSD   ADL Phase Entry     SOB Score total 35     Rest 1     Walk 2     Stairs 4     Bath 1     Dress 2     Shop 2        Pulmonary Function Assessment:     Pulmonary Function Assessment - 02/24/17 0618      Pulmonary Function Tests   FVC% 80 %   FEV1% 93 %   FEV1/FVC Ratio 90.41     Breath   Bilateral Breath Sounds Clear   Shortness of Breath Yes;Limiting activity      Exercise Target Goals:    Exercise Program Goal: Individual exercise prescription set with THRR, safety & activity barriers. Participant demonstrates ability to understand and report RPE using BORG scale, to self-measure pulse accurately, and to acknowledge the importance of the exercise prescription.  Exercise Prescription  Goal: Starting with aerobic activity 30 plus minutes a day, 3 days per week for initial exercise prescription. Provide home exercise prescription and guidelines that participant acknowledges understanding prior to discharge.  Activity Barriers & Risk Stratification:     Activity Barriers & Cardiac Risk Stratification - 03/04/17 1407      Activity Barriers & Cardiac Risk Stratification   Activity Barriers Joint Problems;Deconditioning;Shortness of Breath;Balance Concerns  residuals from stroke have altered his gait, cannot feel right foot/leg      6 Minute Walk:     6 Minute Walk    Row Name 02/23/17 1622         6 Minute Walk   Distance 1250 feet     Walk Time 6 minutes     # of Rest Breaks 0     MPH 2.36     METS 3.86     RPE 13     Perceived Dyspnea  2     VO2 Peak 13.5     Symptoms No     Resting HR 63 bpm     Resting BP 138/62     Max Ex. HR 102 bpm     Max Ex. BP 144/58       Interval HR   Baseline HR 63     3 Minute HR 97     6 Minute HR 102     Interval Heart Rate? Yes       Interval Oxygen   Interval Oxygen? Yes     Baseline Oxygen Saturation % 98 %     3 Minute Oxygen Saturation % 98 %     6 Minute Oxygen Saturation % 96 %       Oxygen Initial Assessment:   Oxygen Re-Evaluation:   Oxygen Discharge (Final Oxygen Re-Evaluation):   Initial Exercise Prescription:     Initial Exercise Prescription - 02/23/17 1600      Date of Initial Exercise RX and Referring Provider   Date 02/23/17   Referring Provider Raul Del     Treadmill   Minutes 15     Recumbant Bike   Level 1   RPM 50   Minutes 15     NuStep   Level 3   Minutes 15     Prescription Details   Frequency (times per week) 3   Duration Progress to 30 minutes of continuous aerobic without signs/symptoms of physical distress     Intensity   THRR 40-80%  of Max Heartrate 104-145   Ratings of Perceived Exertion 11-15   Perceived Dyspnea 0-4     Resistance Training   Training  Prescription Yes   Weight 3   Reps 10-15      Perform Capillary Blood Glucose checks as needed.  Exercise Prescription Changes:     Exercise Prescription Changes    Row Name 03/04/17 1400             Response to Exercise   Blood Pressure (Admit) 126/64       Blood Pressure (Exercise) 126/64       Blood Pressure (Exit) 118/64       Heart Rate (Admit) 72 bpm       Heart Rate (Exercise) 84 bpm       Heart Rate (Exit) 95 bpm       Oxygen Saturation (Admit) 100 %       Oxygen Saturation (Exercise) 96 %       Oxygen Saturation (Exit) 95 %       Rating of Perceived Exertion (Exercise) 11       Perceived Dyspnea (Exercise) 3       Symptoms none       Comments first full day of exercise       Duration Progress to 45 minutes of aerobic exercise without signs/symptoms of physical distress       Intensity THRR unchanged         Progression   Progression Continue to progress workloads to maintain intensity without signs/symptoms of physical distress.       Average METs 2.81         Resistance Training   Training Prescription Yes       Weight 3       Reps 10-15         Interval Training   Interval Training No         Treadmill   MPH 1.7       Grade 0.5       Minutes 15       METs 2.42         NuStep   Level 3       SPM 91       Minutes 15       METs 3.2          Exercise Comments:     Exercise Comments    Row Name 03/04/17 1407           Exercise Comments First full day of exercise!  Patient was oriented to gym and equipment including functions, settings, policies, and procedures.  Patient's individual exercise prescription and treatment plan were reviewed.  All starting workloads were established based on the results of the 6 minute walk test done at initial orientation visit.  The plan for exercise progression was also introduced and progression will be customized based on patient's performance and goals.          Exercise Goals and Review:      Exercise Goals    Row Name 03/04/17 1407             Exercise Goals   Increase Physical Activity Yes       Intervention Provide advice, education, support and counseling about physical activity/exercise needs.;Develop an individualized exercise prescription for aerobic and resistive training based on initial evaluation findings, risk stratification, comorbidities and participant's personal goals.       Expected Outcomes Achievement of increased cardiorespiratory fitness  and enhanced flexibility, muscular endurance and strength shown through measurements of functional capacity and personal statement of participant.       Increase Strength and Stamina Yes       Intervention Provide advice, education, support and counseling about physical activity/exercise needs.;Develop an individualized exercise prescription for aerobic and resistive training based on initial evaluation findings, risk stratification, comorbidities and participant's personal goals.       Expected Outcomes Achievement of increased cardiorespiratory fitness and enhanced flexibility, muscular endurance and strength shown through measurements of functional capacity and personal statement of participant.          Exercise Goals Re-Evaluation :   Discharge Exercise Prescription (Final Exercise Prescription Changes):     Exercise Prescription Changes - 03/04/17 1400      Response to Exercise   Blood Pressure (Admit) 126/64   Blood Pressure (Exercise) 126/64   Blood Pressure (Exit) 118/64   Heart Rate (Admit) 72 bpm   Heart Rate (Exercise) 84 bpm   Heart Rate (Exit) 95 bpm   Oxygen Saturation (Admit) 100 %   Oxygen Saturation (Exercise) 96 %   Oxygen Saturation (Exit) 95 %   Rating of Perceived Exertion (Exercise) 11   Perceived Dyspnea (Exercise) 3   Symptoms none   Comments first full day of exercise   Duration Progress to 45 minutes of aerobic exercise without signs/symptoms of physical distress   Intensity THRR  unchanged     Progression   Progression Continue to progress workloads to maintain intensity without signs/symptoms of physical distress.   Average METs 2.81     Resistance Training   Training Prescription Yes   Weight 3   Reps 10-15     Interval Training   Interval Training No     Treadmill   MPH 1.7   Grade 0.5   Minutes 15   METs 2.42     NuStep   Level 3   SPM 91   Minutes 15   METs 3.2      Nutrition:  Target Goals: Understanding of nutrition guidelines, daily intake of sodium 1500mg , cholesterol 200mg , calories 30% from fat and 7% or less from saturated fats, daily to have 5 or more servings of fruits and vegetables.  Biometrics:     Pre Biometrics - 02/23/17 1621      Pre Biometrics   Height 5' 7.25" (1.708 m)   Weight 171 lb 3.2 oz (77.7 kg)   Waist Circumference 40.25 inches   Hip Circumference 40.75 inches   Waist to Hip Ratio 0.99 %   BMI (Calculated) 26.7       Nutrition Therapy Plan and Nutrition Goals:   Nutrition Discharge: Rate Your Plate Scores:   Nutrition Goals Re-Evaluation:   Nutrition Goals Discharge (Final Nutrition Goals Re-Evaluation):   Psychosocial: Target Goals: Acknowledge presence or absence of significant depression and/or stress, maximize coping skills, provide positive support system. Participant is able to verbalize types and ability to use techniques and skills needed for reducing stress and depression.   Initial Review & Psychosocial Screening:     Initial Psych Review & Screening - 02/23/17 [place]? 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 Being very athletic and musical with piano and guitar, he is now unable to do these things , he loved doing. Over the years, he has  worked through his anger and disappointment . Today he is excited about [facility] and exercising again  and working on his balance. His wife has been very supportive.     Barriers   Psychosocial barriers to participate in program The patient should benefit from training in stress management and relaxation.     Screening Interventions   Interventions Yes;Encouraged to exercise;Program counselor consult   Expected Outcomes Short Term goal: Utilizing psychosocial counselor, staff and physician to assist with identification of specific Stressors or current issues interfering with healing process. Setting desired goal for each stressor or current issue identified.;Long Term Goal: Stressors or current issues are controlled or eliminated.;Short Term goal: Identification and review with participant of any Quality of Life or Depression concerns found by scoring the questionnaire.      Quality of Life Scores:     Quality of Life - 02/23/17 1545      Quality of Life Scores   Health/Function Pre 20.19 %   Socioeconomic Pre 21 %   Psych/Spiritual Pre 19.86 %   Family Pre 21 %   GLOBAL Pre 20.34 %      PHQ-9: Recent Review Flowsheet Data    Depression screen [facility] 2/9 02/24/2017   Decreased Interest 0   Down, Depressed, Hopeless 0   PHQ - 2 Score 0   Altered sleeping 3   Tired, decreased energy 3   Change in appetite 0   Feeling bad or failure about yourself  0   Trouble concentrating 0   Moving slowly or fidgety/restless 0   Suicidal thoughts 0   PHQ-9 Score 6   Difficult doing work/chores Not difficult at all     Interpretation of Total Score  Total Score Depression Severity:  1-4 = Minimal depression, 5-9 = Mild depression, 10-14 = Moderate depression, 15-19 = Moderately severe depression, 20-27 = Severe depression   Psychosocial Evaluation and Intervention:   Psychosocial Re-Evaluation:   Psychosocial Discharge (Final Psychosocial Re-Evaluation):   Education: Education Goals: Education classes will be provided on a weekly basis, covering required topics. Participant will  state understanding/return demonstration of topics presented.  Learning Barriers/Preferences:     Learning Barriers/Preferences - 02/24/17 0617      Learning Barriers/Preferences   Learning Barriers Exercise Concerns;None   Learning Preferences None      Education Topics: Initial Evaluation Education: - Verbal, written and demonstration of respiratory meds, RPE/PD scales, oximetry and breathing techniques. Instruction on use of nebulizers and MDIs: cleaning and proper use, rinsing mouth with steroid doses and importance of monitoring MDI activations.   Pulmonary Rehab from 03/06/2017 in [facility] Cardiac and Pulmonary Rehab  Date  02/23/17  Educator  LB  Instruction Review Code  2- meets goals/outcomes      General Nutrition Guidelines/Fats and Fiber: -Group instruction provided by verbal, written material, models and posters to present the general guidelines for heart healthy nutrition. Gives an explanation and review of dietary fats and fiber.   Controlling Sodium/Reading Food Labels: -Group verbal and written material supporting the discussion of sodium use in heart healthy nutrition. Review and explanation with models, verbal and written materials for utilization of the food label.   Exercise Physiology & Risk Factors: - Group verbal and written instruction with models to review the exercise physiology of the cardiovascular system and associated critical values. Details cardiovascular disease risk factors and the goals associated with each risk factor.   Pulmonary Rehab from 03/06/2017 in [facility] Cardiac and Pulmonary Rehab  Date  03/06/17  Educator  [place]  Instruction Review Code  2- meets goals/outcomes      Aerobic Exercise & Resistance Training: - Gives group verbal and written discussion on the health impact of inactivity. On the components of aerobic and resistive training programs and the benefits of this training and how to safely progress through these  programs.   Flexibility, Balance, General Exercise Guidelines: - Provides group verbal and written instruction on the benefits of flexibility and balance training programs. Provides general exercise guidelines with specific guidelines to those with heart or lung disease. Demonstration and skill practice provided.   Stress Management: - Provides group verbal and written instruction about the health risks of elevated stress, cause of high stress, and healthy ways to reduce stress.   Pulmonary Rehab from 03/06/2017 in [facility] Cardiac and Pulmonary Rehab  Date  03/04/17  Educator  [facility]  Instruction Review Code  2- meets goals/outcomes      Depression: - Provides group verbal and written instruction on the correlation between heart/lung disease and depressed mood, treatment options, and the stigmas associated with seeking treatment.   Exercise & Equipment Safety: - Individual verbal instruction and demonstration of equipment use and safety with use of the equipment.   Pulmonary Rehab from 03/06/2017 in [facility] Cardiac and Pulmonary Rehab  Date  03/04/17  Educator  [facility]  Instruction Review Code  2- meets goals/outcomes      Infection Prevention: - Provides verbal and written material to individual with discussion of infection control including proper hand washing and proper equipment cleaning during exercise session.   Pulmonary Rehab from 03/06/2017 in [facility] Cardiac and Pulmonary Rehab  Date  02/23/17  Educator  LB  Instruction Review Code  2- meets goals/outcomes      Falls Prevention: - Provides verbal and written material to individual with discussion of falls prevention and safety.   Pulmonary Rehab from 03/06/2017 in [facility] Cardiac and Pulmonary Rehab  Date  02/23/17  Educator  LB  Instruction Review Code  2- meets goals/outcomes      Diabetes: - Individual verbal and written instruction to review signs/symptoms of diabetes, desired ranges of glucose level fasting, after meals and with  exercise. Advice that pre and post exercise glucose checks will be done for 3 sessions at entry of program.   Chronic Lung Diseases: - Group verbal and written instruction to review new updates, new respiratory medications, new advancements in procedures and treatments. Provide informative websites and "800" numbers of self-education.   Lung Procedures: - Group verbal and written instruction to describe testing methods done to diagnose lung disease. Review the outcome of test results. Describe the treatment choices: Pulmonary Function Tests, ABGs and oximetry.   Energy Conservation: - Provide group verbal and written instruction for methods to conserve energy, plan and organize activities. Instruct on pacing techniques, use of adaptive equipment and posture/positioning to relieve shortness of breath.   Triggers: - Group verbal and written instruction to review types of environmental controls: home humidity, furnaces, filters, dust mite/pet prevention, HEPA vacuums. To discuss weather changes, air quality and the benefits of nasal washing.   Exacerbations: - Group verbal and written instruction to provide: warning signs, infection symptoms, calling MD promptly, preventive modes, and value of vaccinations. Review: effective airway clearance, coughing and/or vibration techniques. Create an Sports administrator.   Oxygen: - Individual and group verbal and written instruction on oxygen therapy. Includes supplement oxygen, available portable oxygen systems, continuous and intermittent flow rates, oxygen safety, concentrators, and Medicare reimbursement for  oxygen.   Respiratory Medications: - Group verbal and written instruction to review medications for lung disease. Drug class, frequency, complications, importance of spacers, rinsing mouth after steroid MDI's, and proper cleaning methods for nebulizers.   AED/CPR: - Group verbal and written instruction with the use of models to demonstrate the  basic use of the AED with the basic ABC's of resuscitation.   Breathing Retraining: - Provides individuals verbal and written instruction on purpose, frequency, and proper technique of diaphragmatic breathing and pursed-lipped breathing. Applies individual practice skills.   Pulmonary Rehab from 03/06/2017 in [facility] Cardiac and Pulmonary Rehab  Date  02/23/17  Educator  LB  Instruction Review Code  2- meets goals/outcomes      Anatomy and Physiology of the Lungs: - Group verbal and written instruction with the use of models to provide basic lung anatomy and physiology related to function, structure and complications of lung disease.   Heart Failure: - Group verbal and written instruction on the basics of heart failure: signs/symptoms, treatments, explanation of ejection fraction, enlarged heart and cardiomyopathy.   Sleep Apnea: - Individual verbal and written instruction to review Obstructive Sleep Apnea. Review of risk factors, methods for diagnosing and types of masks and machines for OSA.   Pulmonary Rehab from 03/06/2017 in [facility] Cardiac and Pulmonary Rehab  Date  02/23/17  Educator  LB  Instruction Review Code  2- meets goals/outcomes      Anxiety: - Provides group, verbal and written instruction on the correlation between heart/lung disease and anxiety, treatment options, and management of anxiety.   Pulmonary Rehab from 03/06/2017 in [facility] Cardiac and Pulmonary Rehab  Date  03/04/17  Educator  [facility]  Instruction Review Code  2- Meets goals/outcomes      Relaxation: - Provides group, verbal and written instruction about the benefits of relaxation for patients with heart/lung disease. Also provides patients with examples of relaxation techniques.   Knowledge Questionnaire Score:     Knowledge Questionnaire Score - 02/24/17 0617      Knowledge Questionnaire Score   Pre Score 8/10       Core Components/Risk Factors/Patient Goals at Admission:     Personal Goals and Risk  Factors at Admission - 02/23/17 1545      Core Components/Risk Factors/Patient Goals on Admission    Weight Management Yes   Intervention --  Wife cooks very healthy - lots of salads and vegetables   Admit Weight 171 lb 3.2 oz (77.7 kg)   Goal Weight: Short Term 166 lb (75.3 kg)   Goal Weight: Long Term 140 lb (63.5 kg)   Expected Outcomes Short Term: Continue to assess and modify interventions until short term weight is achieved;Long Term: Adherence to nutrition and physical activity/exercise program aimed toward attainment of established weight goal;Weight Maintenance: Understanding of the daily nutrition guidelines, which includes 25-35% calories from fat, 7% or less cal from saturated fats, less than 200mg  cholesterol, less than 1.5gm of sodium, & 5 or more servings of fruits and vegetables daily;Weight Loss: Understanding of general recommendations for a balanced deficit meal plan, which promotes 1-2 lb weight loss per week and includes a negative energy balance of [phone] kcal/d;Understanding recommendations for meals to include 15-35% energy as protein, 25-35% energy from fat, 35-60% energy from carbohydrates, less than 200mg  of dietary cholesterol, 20-35 gm of total fiber daily   Improve shortness of breath with ADL's Yes   Intervention Provide education, individualized exercise plan and daily activity instruction to help decrease symptoms of SOB  with activities of daily living.   Expected Outcomes Short Term: Achieves a reduction of symptoms when performing activities of daily living.   Develop more efficient breathing techniques such as purse lipped breathing and diaphragmatic breathing; and practicing self-pacing with activity Yes   Intervention Provide education, demonstration and support about specific breathing techniuqes utilized for more efficient breathing. Include techniques such as pursed lipped breathing, diaphragmatic breathing and self-pacing activity.   Expected Outcomes Short  Term: Participant will be able to demonstrate and use breathing techniques as needed throughout daily activities.   Increase knowledge of respiratory medications and ability to use respiratory devices properly  Yes   Intervention Provide education and demonstration as needed of appropriate use of medications, inhalers, and oxygen therapy.  Good understanding of CPAP; Uses suto settings.    Expected Outcomes Short Term: Achieves understanding of medications use. Understands that oxygen is a medication prescribed by physician. Demonstrates appropriate use of inhaler and oxygen therapy.   Lipids Yes   Intervention Provide education and support for participant on nutrition & aerobic/resistive exercise along with prescribed medications to achieve LDL 70mg , HDL >40mg .   Expected Outcomes Short Term: Participant states understanding of desired cholesterol values and is compliant with medications prescribed. Participant is following exercise prescription and nutrition guidelines.;Long Term: Cholesterol controlled with medications as prescribed, with individualized exercise RX and with personalized nutrition plan. Value goals: LDL < 70mg , HDL > 40 mg.      Core Components/Risk Factors/Patient Goals Review:      Goals and Risk Factor Review    Row Name 03/04/17 1409             Core Components/Risk Factors/Patient Goals Review   Personal Goals Review Develop more efficient breathing techniques such as purse lipped breathing and diaphragmatic breathing and practicing self-pacing with activity.       Review Reviewed how to do pursed lip breathing and when to use it.         Expected Outcomes Short: Gerald Stabs will use pursed lip breathing during exercise.  Long: Gerald Stabs will become independent in using pursed lip breathing          Core Components/Risk Factors/Patient Goals at Discharge (Final Review):      Goals and Risk Factor Review - 03/04/17 1409      Core Components/Risk Factors/Patient Goals  Review   Personal Goals Review Develop more efficient breathing techniques such as purse lipped breathing and diaphragmatic breathing and practicing self-pacing with activity.   Review Reviewed how to do pursed lip breathing and when to use it.     Expected Outcomes Short: Gerald Stabs will use pursed lip breathing during exercise.  Long: Gerald Stabs will become independent in using pursed lip breathing      ITP Comments:   Comments: 30 Day Note Review

## 2017-03-11 ENCOUNTER — Encounter: Payer: 59 | Admitting: *Deleted

## 2017-03-11 LAB — GLUCOSE, CAPILLARY
Glucose-Capillary: 177 mg/dL — ABNORMAL HIGH (ref 65–99)
Glucose-Capillary: 160 mg/dL — ABNORMAL HIGH (ref 65–99)

## 2017-03-11 NOTE — Progress Notes (Signed)
Daily Session Note  Patient Details  Name: TAVITA EASTHAM MRN: [id number] Date of Birth: 22-Nov-1961 Referring Provider:     Pulmonary Rehab from 02/23/2017 in [facility] Cardiac and Pulmonary Rehab  Referring Provider  Raul Del      Encounter Date: 03/11/2017  Check In:     Session Check In - 03/11/17 1129      Check-In   Location [facility]-Cardiac & Pulmonary Rehab   Staff Present Alberteen Sam, MA, ACSM RCEP, Exercise Physiologist;Laureen Owens Shark, BS, RRT, Respiratory Therapist;Carroll Enterkin, RN, BSN   Supervising physician immediately available to respond to emergencies [facility] immediately available ER MD   Physician(s) Drs. Jimmye Norman and Darl Householder   Medication changes reported     No   Fall or balance concerns reported    No   Warm-up and Cool-down Performed as group-led Location manager Performed Yes   VAD Patient? No     Pain Assessment   Currently in Pain? No/denies   Multiple Pain Sites No         History  Smoking Status  . Never Smoker  Smokeless Tobacco  . Never Used    Goals Met:  Proper associated with RPD/PD & O2 Sat Independence with exercise equipment Exercise tolerated well Strength training completed today  Goals Unmet:  Not Applicable  Comments: Pt able to follow exercise prescription today without complaint.  Will continue to monitor for progression. Gerald Stabs tried the [employer] today and really liked it.  We will try it in place of the recumbent bike which is hard on his right foot.   Dr. Emily Filbert is Medical Director for [place] and [facility].

## 2017-03-18 ENCOUNTER — Encounter: Payer: 59 | Admitting: *Deleted

## 2017-03-18 NOTE — Progress Notes (Signed)
Daily Session Note  Patient Details  Name: Terrence Brown MRN: [id number] Date of Birth: 09/02/1961 Referring Provider:     Pulmonary Rehab from 02/23/2017 in [facility] Cardiac and Pulmonary Rehab  Referring Provider  Fleming      Encounter Date: 03/18/2017  Check In:     Session Check In - 03/18/17 1131      Check-In   Location [facility]-Cardiac & Pulmonary Rehab   Staff Present  , MA, ACSM RCEP, Exercise Physiologist;Laureen Brown, BS, RRT, Respiratory Therapist;Rebecca Sickles, DPT, CEEA   Supervising physician immediately available to respond to emergencies [facility] immediately available ER MD   Physician(s) Drs. McShane and Robinson   Medication changes reported     No   Fall or balance concerns reported    No   Warm-up and Cool-down Performed as group-led instruction   Resistance Training Performed Yes   VAD Patient? No     Pain Assessment   Currently in Pain? No/denies   Multiple Pain Sites No         History  Smoking Status  . Never Smoker  Smokeless Tobacco  . Never Used    Goals Met:  Proper associated with RPD/PD & O2 Sat Independence with exercise equipment Using PLB without cueing & demonstrates good technique Exercise tolerated well Strength training completed today  Goals Unmet:  Not Applicable  Comments: Pt able to follow exercise prescription today without complaint.  Will continue to monitor for progression. Reviewed home exercise with pt today.  Pt plans to walk at home for exercise.  He is also looking in purchasing a piece of equipment or joining our [facility] [employer].  At this point, he is going to try to add in one extra day at home fore 2-3 weeks, then two days at home.  Reviewed THR, pulse, RPE, sign and symptoms, and when to call 911 or MD.  Also discussed weather considerations and indoor options.  Pt voiced understanding.    Dr. Mark Miller is Medical Director for [facility] and [facility]. 

## 2017-03-20 ENCOUNTER — Encounter: Payer: 59 | Admitting: *Deleted

## 2017-03-20 LAB — GLUCOSE, CAPILLARY: GLUCOSE-CAPILLARY: 151 mg/dL — AB (ref 65–99)

## 2017-03-20 NOTE — Progress Notes (Signed)
Daily Session Note  Patient Details  Name: Terrence Brown MRN: [id number] Date of Birth: 04/03/1961 Referring Provider:     Pulmonary Rehab from 02/23/2017 in [facility] Cardiac and Pulmonary Rehab  Referring Provider  Raul Del      Encounter Date: 03/20/2017  Check In:     Session Check In - 03/20/17 1234      Check-In   Location [facility]-Cardiac & Pulmonary Rehab   Staff Present Nyoka Cowden, RN, BSN, Willette Pa, MA, ACSM RCEP, Exercise Physiologist   Supervising physician immediately available to respond to emergencies [facility] immediately available ER MD   Physician(s) Drs. McShane and Robinson   Medication changes reported     No   Fall or balance concerns reported    No   Warm-up and Cool-down Performed as group-led Location manager Performed Yes   VAD Patient? No     Pain Assessment   Currently in Pain? No/denies   Multiple Pain Sites No         History  Smoking Status  . Never Smoker  Smokeless Tobacco  . Never Used    Goals Met:  Proper associated with RPD/PD & O2 Sat Independence with exercise equipment Using PLB without cueing & demonstrates good technique Exercise tolerated well Strength training completed today  Goals Unmet:  Not Applicable  Comments: Pt able to follow exercise prescription today without complaint.  Will continue to monitor for progression.    Dr. Emily Filbert is Medical Director for [place] and [facility].

## 2017-03-25 ENCOUNTER — Encounter: Payer: 59 | Admitting: *Deleted

## 2017-03-25 NOTE — Progress Notes (Signed)
Daily Session Note  Patient Details  Name: Terrence Brown MRN: [id number] Date of Birth: Jan 21, 1961 Referring Provider:     Pulmonary Rehab from 02/23/2017 in [facility] Cardiac and Pulmonary Rehab  Referring Provider  Raul Del      Encounter Date: 03/25/2017  Check In:     Session Check In - 03/25/17 1136      Check-In   Location [facility]-Cardiac & Pulmonary Rehab   Staff Present Alberteen Sam, MA, ACSM RCEP, Exercise Physiologist;Mary Kellie Shropshire, RN, BSN, [place], BS, RRT, Respiratory Therapist   Supervising physician immediately available to respond to emergencies [facility] immediately available ER MD   Physician(s) Drs. Joni Fears and Christiana   Medication changes reported     No   Fall or balance concerns reported    No   Warm-up and Cool-down Performed as group-led Location manager Performed Yes   VAD Patient? No     Pain Assessment   Currently in Pain? No/denies   Multiple Pain Sites No           Exercise Prescription Changes - 03/24/17 1500      Response to Exercise   Blood Pressure (Admit) 132/70   Blood Pressure (Exercise) 120/70   Blood Pressure (Exit) 132/74   Heart Rate (Admit) 66 bpm   Heart Rate (Exercise) 77 bpm   Heart Rate (Exit) 67 bpm   Oxygen Saturation (Admit) 99 %   Oxygen Saturation (Exercise) 94 %   Oxygen Saturation (Exit) 97 %   Rating of Perceived Exertion (Exercise) 10   Perceived Dyspnea (Exercise) 1   Symptoms none   Duration Continue with 45 min of aerobic exercise without signs/symptoms of physical distress.   Intensity THRR unchanged     Progression   Progression Continue to progress workloads to maintain intensity without signs/symptoms of physical distress.   Average METs 2.61     Resistance Training   Training Prescription Yes   Weight 4 lbs   Reps 10-15     Interval Training   Interval Training No     Treadmill   MPH 2.3   Grade 0.5   Minutes 15   METs 2.92     Recumbant Bike   Level 5    Minutes 15   METs 2.5     NuStep   Level 45   SPM 85   Minutes 15   METs 3.2     Recumbant Elliptical   Level 1.5   RPM 44   Minutes 15   METs 1.7     Home Exercise Plan   Plans to continue exercise at Home (comment)  walking, purchase equipment or [facility]   Frequency Add 1 additional day to program exercise sessions.   Initial Home Exercises Provided 03/18/17      History  Smoking Status  . Never Smoker  Smokeless Tobacco  . Never Used    Goals Met:  Proper associated with RPD/PD & O2 Sat Independence with exercise equipment Using PLB without cueing & demonstrates good technique Exercise tolerated well Strength training completed today  Goals Unmet:  Not Applicable  Comments: Pt able to follow exercise prescription today without complaint.  Will continue to monitor for progression. Terrence Brown tried out the new recumbent bike and it did work better with his right leg/foot.  However, he now likes the recumbent elliptical as it gives him a good workout.   Dr. Emily Filbert is Medical Director for [place] and [facility].

## 2017-03-27 ENCOUNTER — Encounter: Payer: 59 | Admitting: *Deleted

## 2017-03-27 NOTE — Progress Notes (Signed)
Daily Session Note  Patient Details  Name: Terrence Brown MRN: [id number] Date of Birth: 21-Aug-1961 Referring Provider:     Pulmonary Rehab from 02/23/2017 in [facility] Cardiac and Pulmonary Rehab  Referring Provider  Raul Del      Encounter Date: 03/27/2017  Check In:     Session Check In - 03/27/17 1143      Check-In   Location [facility]-Cardiac & Pulmonary Rehab   Staff Present Nyoka Cowden, RN, BSN, Willette Pa, MA, ACSM RCEP, Exercise Physiologist  Darel Hong RN BSN   Supervising physician immediately available to respond to emergencies [facility] immediately available ER MD   Physician(s) Drs. Quale and McShane   Medication changes reported     No   Fall or balance concerns reported    No   Tobacco Cessation No Change   Warm-up and Cool-down Performed as group-led instruction   Resistance Training Performed Yes   VAD Patient? No     Pain Assessment   Currently in Pain? No/denies   Multiple Pain Sites No         History  Smoking Status  . Never Smoker  Smokeless Tobacco  . Never Used    Goals Met:  Proper associated with RPD/PD & O2 Sat Independence with exercise equipment Using PLB without cueing & demonstrates good technique Exercise tolerated well Strength training completed today  Goals Unmet:  Not Applicable  Comments: Pt able to follow exercise prescription today without complaint.  Will continue to monitor for progression.    Dr. Emily Filbert is Medical Director for [place] and [facility].

## 2017-04-01 ENCOUNTER — Encounter: Payer: 59 | Attending: Specialist | Admitting: *Deleted

## 2017-04-01 NOTE — Progress Notes (Signed)
Daily Session Note  Patient Details  Name: Terrence Brown MRN: [id number] Date of Birth: 01/29/1961 Referring Provider:     Pulmonary Rehab from 02/23/2017 in [facility] Cardiac and Pulmonary Rehab  Referring Provider  Fleming      Encounter Date: 04/01/2017  Check In:     Session Check In - 04/01/17 1141      Check-In   Location [facility]-Cardiac & Pulmonary Rehab   Staff Present Jessica Hawkins, MA, ACSM RCEP, Exercise Physiologist;Other;Laureen Brown, BS, RRT, Respiratory Therapist  Krista Spencer, RN, BSN   Supervising physician immediately available to respond to emergencies [facility] immediately available ER MD   Physician(s) Drs. Norman and Lord   Medication changes reported     No   Fall or balance concerns reported    No   Warm-up and Cool-down Performed as group-led instruction   Resistance Training Performed Yes   VAD Patient? No     Pain Assessment   Currently in Pain? No/denies   Multiple Pain Sites No         History  Smoking Status  . Never Smoker  Smokeless Tobacco  . Never Used    Goals Met:  Proper associated with RPD/PD & O2 Sat Independence with exercise equipment Using PLB without cueing & demonstrates good technique Exercise tolerated well Strength training completed today  Goals Unmet:  Not Applicable  Comments: Pt able to follow exercise prescription today without complaint.  Will continue to monitor for progression.    Dr. Mark Miller is Medical Director for [facility] and [facility]. 

## 2017-04-03 ENCOUNTER — Encounter: Payer: 59 | Admitting: *Deleted

## 2017-04-03 NOTE — Progress Notes (Signed)
Daily Session Note  Patient Details  Name: Terrence Brown MRN: [id number] Date of Birth: 10-08-1961 Referring Provider:     Pulmonary Rehab from 02/23/2017 in [facility] Cardiac and Pulmonary Rehab  Referring Provider  Raul Del      Encounter Date: 04/03/2017  Check In:     Session Check In - 04/03/17 1321      Check-In   Location [facility]-Cardiac & Pulmonary Rehab   Staff Present Heath Lark, RN, BSN, CCRP;Jessica [place], [place], ACSM RCEP, Exercise Physiologist;Other  Darel Hong RN BSN   Supervising physician immediately available to respond to emergencies [facility] immediately available ER MD   Physician(s) Drs. Shaevitz and [employer]   Medication changes reported     No   Fall or balance concerns reported    No   Tobacco Cessation No Change   Warm-up and Cool-down Performed as group-led Location manager Performed Yes   VAD Patient? No     Pain Assessment   Currently in Pain? No/denies         History  Smoking Status  . Never Smoker  Smokeless Tobacco  . Never Used    Goals Met:  Proper associated with RPD/PD & O2 Sat Independence with exercise equipment Using PLB without cueing & demonstrates good technique Exercise tolerated well Strength training completed today  Goals Unmet:  Not Applicable  Comments: Pt able to follow exercise prescription today without complaint.  Will continue to monitor for progression.    Dr. Emily Filbert is Medical Director for [place] and [facility].

## 2017-04-06 ENCOUNTER — Encounter: Payer: Self-pay | Admitting: Respiratory Therapy

## 2017-04-06 NOTE — Progress Notes (Signed)
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 Take with a full glass of water on an empty stomach., Disp: , Rfl:    aspirin EC 81 MG tablet, Take 81 mg by mouth daily., Disp: , Rfl:    atenolol (TENORMIN) 25 MG tablet, Take 25 mg by mouth daily., Disp: , Rfl:    azaTHIOprine (IMURAN) 50 MG tablet, Take 50 mg by mouth daily., Disp: , Rfl:    calcium carbonate (TUMS - DOSED IN MG ELEMENTAL CALCIUM) 500 MG chewable tablet, Chew 1 tablet by mouth daily., Disp: , Rfl:    cephALEXin (KEFLEX) 500 MG capsule, Take 1 capsule (500 mg total) by mouth 3 (three) times daily., Disp: 21 capsule, Rfl: 0   ferrous sulfate 325 (65 FE) MG tablet, Take 325 mg by mouth daily with breakfast., Disp: , Rfl:    insulin aspart (NOVOLOG) 100 UNIT/ML injection, Inject into the skin 3 (three) times daily before meals., Disp: , Rfl:    insulin lispro (HUMALOG) 100 UNIT/ML injection, Inject into the skin 3 (three) times daily before meals., Disp: , Rfl:    insulin NPH Human (HUMULIN N,NOVOLIN N) 100 UNIT/ML injection, Inject 40 Units into the skin daily before breakfast., Disp: , Rfl:    insulin regular (NOVOLIN R,HUMULIN R) 100 units/mL injection, Inject 0-10 Units into the skin 3 (three) times daily before meals., Disp: , Rfl:    lipase/protease/amylase (CREON) 12000 UNITS CPEP capsule, Take 12,000 Units by mouth., Disp: , Rfl:    magnesium oxide (MAG-OX) 400 MG tablet, Take  400 mg by mouth daily., Disp: , Rfl:    Multiple Vitamin (MULTIVITAMIN) capsule, Take 1 capsule by mouth daily., Disp: , Rfl:    nystatin (MYCOSTATIN) 100000 UNITS/ML SUSP, Take 0.5 mLs by mouth every 6 (six) hours., Disp: , Rfl:    predniSONE (DELTASONE) 10 MG tablet, Take 10 mg by mouth daily with breakfast., Disp: , Rfl:    tacrolimus (PROGRAF) 1 MG capsule, Take 1 mg by mouth 2 (two) times daily., Disp: , Rfl:    traMADol (ULTRAM) 50 MG tablet, Take 50 mg by mouth every 6 (six) hours as needed., Disp: , Rfl:    tretinoin (RETIN-A) 0.01 % gel, Apply topically at bedtime., Disp: , Rfl:    zolpidem (AMBIEN) 5 MG tablet, Take 5 mg by mouth at bedtime as needed for sleep., Disp: , Rfl:   Past Medical History: Past Medical History:  Diagnosis Date   Cystic fibrosis ([place])    Diabetes mellitus without complication ([place])    Osteopenia    Sleep apnea    TIA (transient ischemic attack)     Tobacco Use: History  Smoking Status   Never Smoker  Smokeless Tobacco   Never Used    Labs: Recent Review Flowsheet Data    There is no flowsheet data to display.       ADL UCSD:     Pulmonary Assessment Scores  [place] Name 02/23/17 0402         ADL UCSD   ADL Phase Entry     SOB Score total 35     Rest 1     Walk 2     Stairs 4     Bath 1     Dress 2     Shop 2        Pulmonary Function Assessment:     Pulmonary Function Assessment - 02/24/17 0618      Pulmonary Function Tests   FVC% 80 %   FEV1% 93 %   FEV1/FVC Ratio 90.41     Breath   Bilateral Breath Sounds Clear   Shortness of Breath Yes;Limiting activity      Exercise Target Goals:    Exercise Program Goal: Individual exercise prescription set with THRR, safety & activity barriers. Participant demonstrates ability to understand and report RPE using BORG scale, to self-measure pulse accurately, and to acknowledge the importance of the exercise prescription.  Exercise Prescription  Goal: Starting with aerobic activity 30 plus minutes a day, 3 days per week for initial exercise prescription. Provide home exercise prescription and guidelines that participant acknowledges understanding prior to discharge.  Activity Barriers & Risk Stratification:     Activity Barriers & Cardiac Risk Stratification - 03/04/17 1407      Activity Barriers & Cardiac Risk Stratification   Activity Barriers Joint Problems;Deconditioning;Shortness of Breath;Balance Concerns  residuals from stroke have altered his gait, cannot feel right foot/leg      6 Minute Walk:     6 Minute Walk    Row Name 02/23/17 1622         6 Minute Walk   Distance 1250 feet     Walk Time 6 minutes     # of Rest Breaks 0     MPH 2.36     METS 3.86     RPE 13     Perceived Dyspnea  2     VO2 Peak 13.5     Symptoms No     Resting HR 63 bpm     Resting BP 138/62     Max Ex. HR 102 bpm     Max Ex. BP 144/58       Interval HR   Baseline HR 63     3 Minute HR 97     6 Minute HR 102     Interval Heart Rate? Yes       Interval Oxygen   Interval Oxygen? Yes     Baseline Oxygen Saturation % 98 %     3 Minute Oxygen Saturation % 98 %     6 Minute Oxygen Saturation % 96 %       Oxygen Initial Assessment:   Oxygen Re-Evaluation:   Oxygen Discharge (Final Oxygen Re-Evaluation):   Initial Exercise Prescription:     Initial Exercise Prescription - 02/23/17 1600      Date of Initial Exercise RX and Referring Provider   Date 02/23/17   Referring Provider Raul Del     Treadmill   Minutes 15     Recumbant Bike   Level 1   RPM 50   Minutes 15     NuStep   Level 3   Minutes 15     Prescription Details   Frequency (times per week) 3   Duration Progress to 30 minutes of continuous aerobic without signs/symptoms of physical distress     Intensity   THRR 40-80%  of Max Heartrate 104-145   Ratings of Perceived Exertion 11-15   Perceived Dyspnea 0-4     Resistance Training   Training  Prescription Yes   Weight 3   Reps 10-15      Perform Capillary Blood Glucose checks as needed.  Exercise Prescription Changes:     Exercise Prescription Changes    Row Name 03/04/17 1400 03/11/17 1300 03/18/17 1500 03/24/17 1500       Response to Exercise   Blood Pressure (Admit) 126/64 118/72  -- 132/70    Blood Pressure (Exercise) 126/64 142/60  -- 120/70    Blood Pressure (Exit) 118/64 132/72  -- 132/74    Heart Rate (Admit) 72 bpm 63 bpm  -- 66 bpm    Heart Rate (Exercise) 84 bpm 127 bpm  -- 77 bpm    Heart Rate (Exit) 95 bpm 67 bpm  -- 67 bpm    Oxygen Saturation (Admit) 100 % 100 %  -- 99 %    Oxygen Saturation (Exercise) 96 % 97 %  -- 94 %    Oxygen Saturation (Exit) 95 % 99 %  -- 97 %    Rating of Perceived Exertion (Exercise) 11 12  -- 10    Perceived Dyspnea (Exercise) 3 3  -- 1    Symptoms none none none none    Comments first full day of exercise  --  --  --    Duration Progress to 45 minutes of aerobic exercise without signs/symptoms of physical distress Progress to 45 minutes of aerobic exercise without signs/symptoms of physical distress Progress to 45 minutes of aerobic exercise without signs/symptoms of physical distress Continue with 45 min of aerobic exercise without signs/symptoms of physical distress.    Intensity THRR unchanged THRR unchanged THRR unchanged THRR unchanged      Progression   Progression Continue to progress workloads to maintain intensity without signs/symptoms of physical distress. Continue to progress workloads to maintain intensity without signs/symptoms of physical distress. Continue to progress workloads to maintain intensity without signs/symptoms of physical distress. Continue to progress workloads to maintain intensity without signs/symptoms of physical distress.    Average METs 2.81 2.98 2.98 2.61      Resistance Training   Training Prescription Yes Yes Yes Yes    Weight 3 3 lbs 3 lbs 4 lbs    Reps 10-15 10-15 10-15 10-15       Interval Training   Interval Training No No No No      Treadmill   MPH 1.7 2.1 2.1 2.3    Grade 0.5 0.5 0.5 0.5    Minutes _0 METs 2.42 2.75 2.75 2.92      Recumbant Bike   Level  -- _1 RPM  -- 57 57  --    Minutes  -- _2 METs  -- 3 3 2.5      NuStep   Level _3 45    SPM 91 91 91 85    Minutes _4 METs 3.2 3.2 3.2 3.2      Recumbant Elliptical   Level  --  --  -- 1.5    RPM  --  --  -- 44    Minutes  --  --  -- 15    METs  --  --  -- 1.7      Home Exercise Plan  Plans to continue exercise at  --  -- Home (comment)  walking, purchase equipment or [facility] Home (comment)  walking, purchase equipment or [facility]    Frequency  --  -- Add 1 additional day to program exercise sessions. Add 1 additional day to program exercise sessions.    Initial Home Exercises Provided  --  -- 03/18/17 03/18/17       Exercise Comments:     Exercise Comments    Row Name 03/04/17 1407 03/11/17 1346 03/25/17 1421       Exercise Comments First full day of exercise!  Patient was oriented to gym and equipment including functions, settings, policies, and procedures.  Patient's individual exercise prescription and treatment plan were reviewed.  All starting workloads were established based on the results of the 6 minute walk test done at initial orientation visit.  The plan for exercise progression was also introduced and progression will be customized based on patient's performance and goals. Gerald Stabs tried the [employer] today and really liked it.  We will try it in place of the recumbent bike which is hard on his right foot. Gerald Stabs tried out the new recumbent bike and it did work better with his right leg/foot.  However, he now likes the recumbent elliptical as it gives him a good workout.        Exercise Goals and Review:     Exercise Goals    Row Name 03/04/17 1407             Exercise Goals   Increase Physical Activity Yes        Intervention Provide advice, education, support and counseling about physical activity/exercise needs.;Develop an individualized exercise prescription for aerobic and resistive training based on initial evaluation findings, risk stratification, comorbidities and participant's personal goals.       Expected Outcomes Achievement of increased cardiorespiratory fitness and enhanced flexibility, muscular endurance and strength shown through measurements of functional capacity and personal statement of participant.       Increase Strength and Stamina Yes       Intervention Provide advice, education, support and counseling about physical activity/exercise needs.;Develop an individualized exercise prescription for aerobic and resistive training based on initial evaluation findings, risk stratification, comorbidities and participant's personal goals.       Expected Outcomes Achievement of increased cardiorespiratory fitness and enhanced flexibility, muscular endurance and strength shown through measurements of functional capacity and personal statement of participant.          Exercise Goals Re-Evaluation :     Exercise Goals Re-Evaluation    Row Name 03/11/17 1346 03/18/17 1538 03/24/17 1520 03/25/17 1422       Exercise Goal Re-Evaluation   Exercise Goals Review Increase Strenth and Stamina;Increase Physical Activity Increase Physical Activity Increase Physical Activity;Increase Strenth and Stamina Increase Physical Activity    Comments Gerald Stabs is off to a good start with rehab. He really wants to work hard and is always eager to get going.  He has already increased the NuStep.  We will continue to monitor his progression. Reviewed home exercise with pt today.  Pt plans to walk at home for exercise.  He is also looking in purchasing a piece of equipment or joining our [facility].  At this point, he is going to try to add in one extra day at home fore 2-3 weeks, then two days at home.  Reviewed THR, pulse, RPE,  sign and symptoms, and when to call 911 or MD.  Also  discussed weather considerations and indoor options.  Pt voiced understanding. Gerald Stabs has been doing well in rehab.  He is now doing the recumbent elliptical in place on the bike and doing well.   We will continue to monitor his progression. Gerald Stabs tried out the new recumbent bike and it did work better with his right leg/foot.  However, he now likes the recumbent elliptical as it gives him a good workout.    Expected Outcomes Short: Review home exercise guidelines  Long: Continue to work on strength and stamina Short: Add in one day at home.  Long: Become more independent in exercise on his own. Short: Try new bikes to see if he likes them better.  Long: Continue to work on IT sales professional. Continue to work on strength and stamina by increasing workloads and exercise at home.       Discharge Exercise Prescription (Final Exercise Prescription Changes):     Exercise Prescription Changes - 03/24/17 1500      Response to Exercise   Blood Pressure (Admit) 132/70   Blood Pressure (Exercise) 120/70   Blood Pressure (Exit) 132/74   Heart Rate (Admit) 66 bpm   Heart Rate (Exercise) 77 bpm   Heart Rate (Exit) 67 bpm   Oxygen Saturation (Admit) 99 %   Oxygen Saturation (Exercise) 94 %   Oxygen Saturation (Exit) 97 %   Rating of Perceived Exertion (Exercise) 10   Perceived Dyspnea (Exercise) 1   Symptoms none   Duration Continue with 45 min of aerobic exercise without signs/symptoms of physical distress.   Intensity THRR unchanged     Progression   Progression Continue to progress workloads to maintain intensity without signs/symptoms of physical distress.   Average METs 2.61     Resistance Training   Training Prescription Yes   Weight 4 lbs   Reps 10-15     Interval Training   Interval Training No     Treadmill   MPH 2.3   Grade 0.5   Minutes 15   METs 2.92     Recumbant Bike   Level 5   Minutes 15   METs 2.5     NuStep    Level 45   SPM 85   Minutes 15   METs 3.2     Recumbant Elliptical   Level 1.5   RPM 44   Minutes 15   METs 1.7     Home Exercise Plan   Plans to continue exercise at Home (comment)  walking, purchase equipment or [facility]   Frequency Add 1 additional day to program exercise sessions.   Initial Home Exercises Provided 03/18/17      Nutrition:  Target Goals: Understanding of nutrition guidelines, daily intake of sodium <1567m, cholesterol <2056m calories 30% from fat and 7% or less from saturated fats, daily to have 5 or more servings of fruits and vegetables.  Biometrics:     Pre Biometrics - 02/23/17 1621      Pre Biometrics   Height 5' 7.25" (1.708 m)   Weight 171 lb 3.2 oz (77.7 kg)   Waist Circumference 40.25 inches   Hip Circumference 40.75 inches   Waist to Hip Ratio 0.99 %   BMI (Calculated) 26.7       Nutrition Therapy Plan and Nutrition Goals:   Nutrition Discharge: Rate Your Plate Scores:   Nutrition Goals Re-Evaluation:   Nutrition Goals Discharge (Final Nutrition Goals Re-Evaluation):   Psychosocial: Target Goals: Acknowledge presence or absence of significant depression and/or  stress, maximize coping skills, provide positive support system. Participant is able to verbalize types and ability to use techniques and skills needed for reducing stress and depression.   Initial Review & Psychosocial Screening:     Initial Psych Review & Screening - 02/23/17 [place]? 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 Being very athletic and musical with piano and guitar, he is now unable to do these things , he loved doing. Over the years, he has worked through his anger and disappointment . Today he is excited about [facility] and exercising again and working on his balance. His wife has been very supportive.      Barriers   Psychosocial barriers to participate in program The patient should benefit from training in stress management and relaxation.     Screening Interventions   Interventions Yes;Encouraged to exercise;Program counselor consult   Expected Outcomes Short Term goal: Utilizing psychosocial counselor, staff and physician to assist with identification of specific Stressors or current issues interfering with healing process. Setting desired goal for each stressor or current issue identified.;Long Term Goal: Stressors or current issues are controlled or eliminated.;Short Term goal: Identification and review with participant of any Quality of Life or Depression concerns found by scoring the questionnaire.      Quality of Life Scores:     Quality of Life - 02/23/17 1545      Quality of Life Scores   Health/Function Pre 20.19 %   Socioeconomic Pre 21 %   Psych/Spiritual Pre 19.86 %   Family Pre 21 %   GLOBAL Pre 20.34 %      PHQ-9: Recent Review Flowsheet Data    Depression screen [facility] Show Low 2/9 02/24/2017   Decreased Interest 0   Down, Depressed, Hopeless 0   PHQ - 2 Score 0   Altered sleeping 3   Tired, decreased energy 3   Change in appetite 0   Feeling bad or failure about yourself  0   Trouble concentrating 0   Moving slowly or fidgety/restless 0   Suicidal thoughts 0   PHQ-9 Score 6   Difficult doing work/chores Not difficult at all     Interpretation of Total Score  Total Score Depression Severity:  1-4 = Minimal depression, 5-9 = Mild depression, 10-14 = Moderate depression, 15-19 = Moderately severe depression, 20-27 = Severe depression   Psychosocial Evaluation and Intervention:     Psychosocial Evaluation - 03/18/17 1226      Psychosocial Evaluation & Interventions   Interventions Encouraged to exercise with the program and follow exercise prescription;Stress management education;Relaxation education   Comments Counselor met with Mr. Owensby Gerald Stabs) today for initial  psychosocial evaluation.  He is a 56 year old who was born with Cystic Fibrosis and had a bilateral lung transplant in 2003.  Gerald Stabs has a strong support system with a spouse of 22 years.  He states he has sleep apnea and uses a CPAP machine to help with quality and quantity of sleep.  He denies a history of depression or anxiety ("diagnosed") but admits to being angry and irritable often which could be symptomatic of depression.  Counselor discussed this with Gerald Stabs reporting he is managing this well and is not "yelling at spouse - only the TV."  His PHQ-9 scores were indicative of mild depression which is his admission as well.  He reports he generally is in a positive mood and has stress with his health and his "puppy" who is chewing things up; as well as the [employer] baseball team when they lose!  Chris's goals are to improve his core strength and that of his ankles as he is falling more often with an ankle that turns in when he is tired.  He would also like to increase his overall stamina and strength and possibly lose a few pounds.  Counselor and staff will be following with Gerald Stabs throughout the course of this program.     Expected Outcomes Gerald Stabs will exercise consistently to achieve his stated goals.  He will also attend and participate in the psychoeducational components of this program to help cope with stress better and "yell" less.     Continue Psychosocial Services  Follow up required by staff      Psychosocial Re-Evaluation:   Psychosocial Discharge (Final Psychosocial Re-Evaluation):   Education: Education Goals: Education classes will be provided on a weekly basis, covering required topics. Participant will state understanding/return demonstration of topics presented.  Learning Barriers/Preferences:     Learning Barriers/Preferences - 02/24/17 0617      Learning Barriers/Preferences   Learning Barriers Exercise Concerns;None   Learning Preferences None      Education  Topics: Initial Evaluation Education: - Verbal, written and demonstration of respiratory meds, RPE/PD scales, oximetry and breathing techniques. Instruction on use of nebulizers and MDIs: cleaning and proper use, rinsing mouth with steroid doses and importance of monitoring MDI activations.   Pulmonary Rehab from 04/01/2017 in [facility] Cardiac and Pulmonary Rehab  Date  02/23/17  Educator  [facility]  Instruction Review Code  2- meets goals/outcomes      General Nutrition Guidelines/Fats and Fiber: -Group instruction provided by verbal, written material, models and posters to present the general guidelines for heart healthy nutrition. Gives an explanation and review of dietary fats and fiber.   Controlling Sodium/Reading Food Labels: -Group verbal and written material supporting the discussion of sodium use in heart healthy nutrition. Review and explanation with models, verbal and written materials for utilization of the food label.   Exercise Physiology & Risk Factors: - Group verbal and written instruction with models to review the exercise physiology of the cardiovascular system and associated critical values. Details cardiovascular disease risk factors and the goals associated with each risk factor.   Pulmonary Rehab from 04/01/2017 in [facility] Cardiac and Pulmonary Rehab  Date  03/06/17  Educator  [place]  Instruction Review Code  2- meets goals/outcomes      Aerobic Exercise & Resistance Training: - Gives group verbal and written discussion on the health impact of inactivity. On the components of aerobic and resistive training programs and the benefits of this training and how to safely progress through these programs.   Flexibility, Balance, General Exercise Guidelines: - Provides group verbal and written instruction on the benefits of flexibility and balance training programs. Provides general exercise guidelines with specific guidelines to those with heart or lung disease. Demonstration and skill  practice provided.   Stress Management: - Provides group verbal and written instruction about the health risks of elevated stress, cause of high stress, and healthy ways to reduce stress.   Pulmonary Rehab from 04/01/2017 in [facility] Cardiac and Pulmonary Rehab  Date  03/04/17  Educator  [facility]  Instruction Review Code  2- meets goals/outcomes      Depression: - Provides group verbal and written instruction on the correlation between heart/lung disease and  depressed mood, treatment options, and the stigmas associated with seeking treatment.   Exercise & Equipment Safety: - Individual verbal instruction and demonstration of equipment use and safety with use of the equipment.   Pulmonary Rehab from 04/01/2017 in [facility] Cardiac and Pulmonary Rehab  Date  03/04/17  Educator  [facility]  Instruction Review Code  2- meets goals/outcomes      Infection Prevention: - Provides verbal and written material to individual with discussion of infection control including proper hand washing and proper equipment cleaning during exercise session.   Pulmonary Rehab from 04/01/2017 in [facility] Cardiac and Pulmonary Rehab  Date  02/23/17  Educator  LB  Instruction Review Code  2- meets goals/outcomes      Falls Prevention: - Provides verbal and written material to individual with discussion of falls prevention and safety.   Pulmonary Rehab from 04/01/2017 in [facility] Cardiac and Pulmonary Rehab  Date  02/23/17  Educator  LB  Instruction Review Code  2- meets goals/outcomes      Diabetes: - Individual verbal and written instruction to review signs/symptoms of diabetes, desired ranges of glucose level fasting, after meals and with exercise. Advice that pre and post exercise glucose checks will be done for 3 sessions at entry of program.   Chronic Lung Diseases: - Group verbal and written instruction to review new updates, new respiratory medications, new advancements in procedures and treatments. Provide informative websites  and "800" numbers of self-education.   Pulmonary Rehab from 04/01/2017 in [facility] Cardiac and Pulmonary Rehab  Date  03/11/17  Educator  LB  Instruction Review Code  2- meets goals/outcomes      Lung Procedures: - Group verbal and written instruction to describe testing methods done to diagnose lung disease. Review the outcome of test results. Describe the treatment choices: Pulmonary Function Tests, ABGs and oximetry.   Energy Conservation: - Provide group verbal and written instruction for methods to conserve energy, plan and organize activities. Instruct on pacing techniques, use of adaptive equipment and posture/positioning to relieve shortness of breath.   Triggers: - Group verbal and written instruction to review types of environmental controls: home humidity, furnaces, filters, dust mite/pet prevention, HEPA vacuums. To discuss weather changes, air quality and the benefits of nasal washing.   Pulmonary Rehab from 04/01/2017 in [facility] Cardiac and Pulmonary Rehab  Date  03/25/17  Educator  LB  Instruction Review Code  2- meets goals/outcomes      Exacerbations: - Group verbal and written instruction to provide: warning signs, infection symptoms, calling MD promptly, preventive modes, and value of vaccinations. Review: effective airway clearance, coughing and/or vibration techniques. Create an Sports administrator.   Pulmonary Rehab from 04/01/2017 in [facility] Cardiac and Pulmonary Rehab  Date  03/18/17  Educator  LB  Instruction Review Code  2- meets goals/outcomes      Oxygen: - Individual and group verbal and written instruction on oxygen therapy. Includes supplement oxygen, available portable oxygen systems, continuous and intermittent flow rates, oxygen safety, concentrators, and Medicare reimbursement for oxygen.   Respiratory Medications: - Group verbal and written instruction to review medications for lung disease. Drug class, frequency, complications, importance of spacers, rinsing mouth  after steroid MDI's, and proper cleaning methods for nebulizers.   AED/CPR: - Group verbal and written instruction with the use of models to demonstrate the basic use of the AED with the basic ABC's of resuscitation.   Pulmonary Rehab from 04/01/2017 in [facility] Cardiac and Pulmonary Rehab  Date  03/27/17  Educator  MJA  Instruction Review Code  2- meets goals/outcomes      Breathing Retraining: - Provides individuals verbal and written instruction on purpose, frequency, and proper technique of diaphragmatic breathing and pursed-lipped breathing. Applies individual practice skills.   Pulmonary Rehab from 04/01/2017 in [facility] Cardiac and Pulmonary Rehab  Date  02/23/17  Educator  LB  Instruction Review Code  2- meets goals/outcomes      Anatomy and Physiology of the Lungs: - Group verbal and written instruction with the use of models to provide basic lung anatomy and physiology related to function, structure and complications of lung disease.   Heart Failure: - Group verbal and written instruction on the basics of heart failure: signs/symptoms, treatments, explanation of ejection fraction, enlarged heart and cardiomyopathy.   Sleep Apnea: - Individual verbal and written instruction to review Obstructive Sleep Apnea. Review of risk factors, methods for diagnosing and types of masks and machines for OSA.   Pulmonary Rehab from 04/01/2017 in [facility] Cardiac and Pulmonary Rehab  Date  02/23/17  Educator  LB  Instruction Review Code  2- meets goals/outcomes      Anxiety: - Provides group, verbal and written instruction on the correlation between heart/lung disease and anxiety, treatment options, and management of anxiety.   Pulmonary Rehab from 04/01/2017 in [facility] Cardiac and Pulmonary Rehab  Date  03/04/17  Educator  [facility]  Instruction Review Code  2- Meets goals/outcomes      Relaxation: - Provides group, verbal and written instruction about the benefits of relaxation for patients with  heart/lung disease. Also provides patients with examples of relaxation techniques.   Pulmonary Rehab from 04/01/2017 in [facility] Cardiac and Pulmonary Rehab  Date  04/01/17  Educator  [facility]  Instruction Review Code  2- Meets goals/outcomes      Knowledge Questionnaire Score:     Knowledge Questionnaire Score - 02/24/17 0617      Knowledge Questionnaire Score   Pre Score 8/10       Core Components/Risk Factors/Patient Goals at Admission:     Personal Goals and Risk Factors at Admission - 02/23/17 1545      Core Components/Risk Factors/Patient Goals on Admission    Weight Management Yes   Intervention --  Wife cooks very healthy - lots of salads and vegetables   Admit Weight 171 lb 3.2 oz (77.7 kg)   Goal Weight: Short Term 166 lb (75.3 kg)   Goal Weight: Long Term 140 lb (63.5 kg)   Expected Outcomes Short Term: Continue to assess and modify interventions until short term weight is achieved;Long Term: Adherence to nutrition and physical activity/exercise program aimed toward attainment of established weight goal;Weight Maintenance: Understanding of the daily nutrition guidelines, which includes 25-35% calories from fat, 7% or less cal from saturated fats, less than 257m cholesterol, less than 1.5gm of sodium, & 5 or more servings of fruits and vegetables daily;Weight Loss: Understanding of general recommendations for a balanced deficit meal plan, which promotes 1-2 lb weight loss per week and includes a negative energy balance of [phone] kcal/d;Understanding recommendations for meals to include 15-35% energy as protein, 25-35% energy from fat, 35-60% energy from carbohydrates, less than 2076mof dietary cholesterol, 20-35 gm of total fiber daily   Improve shortness of breath with ADL's Yes   Intervention Provide education, individualized exercise plan and daily activity instruction to help decrease symptoms of SOB with activities of daily living.   Expected Outcomes Short Term: Achieves a  reduction of symptoms when performing activities of  daily living.   Develop more efficient breathing techniques such as purse lipped breathing and diaphragmatic breathing; and practicing self-pacing with activity Yes   Intervention Provide education, demonstration and support about specific breathing techniuqes utilized for more efficient breathing. Include techniques such as pursed lipped breathing, diaphragmatic breathing and self-pacing activity.   Expected Outcomes Short Term: Participant will be able to demonstrate and use breathing techniques as needed throughout daily activities.   Increase knowledge of respiratory medications and ability to use respiratory devices properly  Yes   Intervention Provide education and demonstration as needed of appropriate use of medications, inhalers, and oxygen therapy.  Good understanding of CPAP; Uses suto settings.    Expected Outcomes Short Term: Achieves understanding of medications use. Understands that oxygen is a medication prescribed by physician. Demonstrates appropriate use of inhaler and oxygen therapy.   Lipids Yes   Intervention Provide education and support for participant on nutrition & aerobic/resistive exercise along with prescribed medications to achieve LDL <71m, HDL >442m   Expected Outcomes Short Term: Participant states understanding of desired cholesterol values and is compliant with medications prescribed. Participant is following exercise prescription and nutrition guidelines.;Long Term: Cholesterol controlled with medications as prescribed, with individualized exercise RX and with personalized nutrition plan. Value goals: LDL < 7024mHDL > 40 mg.      Core Components/Risk Factors/Patient Goals Review:      Goals and Risk Factor Review    Row Name 03/04/17 1409 03/30/17 0720           Core Components/Risk Factors/Patient Goals Review   Personal Goals Review Develop more efficient breathing techniques such as purse lipped  breathing and diaphragmatic breathing and practicing self-pacing with activity. Improve shortness of breath with ADL's;Develop more efficient breathing techniques such as purse lipped breathing and diaphragmatic breathing and practicing self-pacing with activity.;Lipids;Increase knowledge of respiratory medications and ability to use respiratory devices properly.      Review Reviewed how to do pursed lip breathing and when to use it.   Mr WalMarland very compliant with his CPAP. After my talk on Tr[employer], Mr WalKarnesntioned how he changes his filteron his CPAP machine every 3 weeks. Using the auto pressure is very comfortable. Mr WalMoncrief in the process of finding a treatmill that has handles which is safer for him. He is also joining the independent gym for exercise on off days from [place] continues to eat salads and vegetables to help control his lipids and loss weight.       Expected Outcomes Short: ChrGerald Stabsll use pursed lip breathing during exercise.  Long: ChrGerald Stabsll become independent in using pursed lip breathing Continue improving his exercise goals and start exercising at home and in the [facility].         Core Components/Risk Factors/Patient Goals at Discharge (Final Review):      Goals and Risk Factor Review - 03/30/17 0720      Core Components/Risk Factors/Patient Goals Review   Personal Goals Review Improve shortness of breath with ADL's;Develop more efficient breathing techniques such as purse lipped breathing and diaphragmatic breathing and practicing self-pacing with activity.;Lipids;Increase knowledge of respiratory medications and ability to use respiratory devices properly.   Review Mr WalPopowski very compliant with his CPAP. After my talk on Triggers, Mr WalTaglierintioned how he changes his filteron his CPAP machine every 3 weeks. Using the auto pressure is very comfortable. Mr WalHellard in the process of finding a treatmill that has handles which is  safer for him. He is also joining  the independent gym for exercise on off days from [place]. He continues to eat salads and vegetables to help control his lipids and loss weight.    Expected Outcomes Continue improving his exercise goals and start exercising at home and in the [facility].      ITP Comments:     ITP Comments    Row Name 03/20/17 1237 04/06/17 0910         ITP Comments Know Your Numbers 03/20/17 CE 30 day note review with Dr Emily Filbert, Medical Director of [place]         Comments: 30 day note review with Dr Emily Filbert, Medical Director of [facility]

## 2017-04-14 ENCOUNTER — Telehealth: Payer: Self-pay | Admitting: Respiratory Therapy

## 2017-04-14 ENCOUNTER — Encounter: Payer: Self-pay | Admitting: Respiratory Therapy

## 2017-04-14 NOTE — Progress Notes (Signed)
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 Take with a full glass of water on an empty stomach., Disp: , Rfl:    aspirin EC 81 MG tablet, Take 81 mg by mouth daily., Disp: , Rfl:    atenolol (TENORMIN) 25 MG tablet, Take 25 mg by mouth daily., Disp: , Rfl:    azaTHIOprine (IMURAN) 50 MG tablet, Take 50 mg by mouth daily., Disp: , Rfl:    calcium carbonate (TUMS - DOSED IN MG ELEMENTAL CALCIUM) 500 MG chewable tablet, Chew 1 tablet by mouth daily., Disp: , Rfl:    cephALEXin (KEFLEX) 500 MG capsule, Take 1 capsule (500 mg total) by mouth 3 (three) times daily., Disp: 21 capsule, Rfl: 0   ferrous sulfate 325 (65 FE) MG tablet, Take 325 mg by mouth daily with breakfast., Disp: , Rfl:    insulin aspart (NOVOLOG) 100 UNIT/ML injection, Inject into the skin 3 (three) times daily before meals., Disp: , Rfl:    insulin lispro (HUMALOG) 100 UNIT/ML injection, Inject into the skin 3 (three) times daily before meals., Disp: , Rfl:    insulin NPH Human (HUMULIN N,NOVOLIN N) 100 UNIT/ML injection, Inject 40 Units into the skin daily before breakfast., Disp: , Rfl:    insulin regular (NOVOLIN R,HUMULIN R) 100 units/mL injection, Inject 0-10 Units into the skin 3 (three) times daily before meals., Disp: , Rfl:    lipase/protease/amylase (CREON) 12000 UNITS CPEP capsule, Take 12,000 Units by mouth., Disp: , Rfl:    magnesium oxide (MAG-OX) 400 MG tablet, Take  400 mg by mouth daily., Disp: , Rfl:    Multiple Vitamin (MULTIVITAMIN) capsule, Take 1 capsule by mouth daily., Disp: , Rfl:    nystatin (MYCOSTATIN) 100000 UNITS/ML SUSP, Take 0.5 mLs by mouth every 6 (six) hours., Disp: , Rfl:    predniSONE (DELTASONE) 10 MG tablet, Take 10 mg by mouth daily with breakfast., Disp: , Rfl:    tacrolimus (PROGRAF) 1 MG capsule, Take 1 mg by mouth 2 (two) times daily., Disp: , Rfl:    traMADol (ULTRAM) 50 MG tablet, Take 50 mg by mouth every 6 (six) hours as needed., Disp: , Rfl:    tretinoin (RETIN-A) 0.01 % gel, Apply topically at bedtime., Disp: , Rfl:    zolpidem (AMBIEN) 5 MG tablet, Take 5 mg by mouth at bedtime as needed for sleep., Disp: , Rfl:   Past Medical History: Past Medical History:  Diagnosis Date   Cystic fibrosis ([place])    Diabetes mellitus without complication ([place])    Osteopenia    Sleep apnea    TIA (transient ischemic attack)     Tobacco Use: History  Smoking Status   Never Smoker  Smokeless Tobacco   Never Used    Labs: Recent Review Flowsheet Data    There is no flowsheet data to display.       ADL UCSD:     Pulmonary Assessment Scores  [place] Name 02/23/17 0402         ADL UCSD   ADL Phase Entry     SOB Score total 35     Rest 1     Walk 2     Stairs 4     Bath 1     Dress 2     Shop 2        Pulmonary Function Assessment:     Pulmonary Function Assessment - 02/24/17 0618      Pulmonary Function Tests   FVC% 80 %   FEV1% 93 %   FEV1/FVC Ratio 90.41     Breath   Bilateral Breath Sounds Clear   Shortness of Breath Yes;Limiting activity      Exercise Target Goals:    Exercise Program Goal: Individual exercise prescription set with THRR, safety & activity barriers. Participant demonstrates ability to understand and report RPE using BORG scale, to self-measure pulse accurately, and to acknowledge the importance of the exercise prescription.  Exercise Prescription  Goal: Starting with aerobic activity 30 plus minutes a day, 3 days per week for initial exercise prescription. Provide home exercise prescription and guidelines that participant acknowledges understanding prior to discharge.  Activity Barriers & Risk Stratification:     Activity Barriers & Cardiac Risk Stratification - 03/04/17 1407      Activity Barriers & Cardiac Risk Stratification   Activity Barriers Joint Problems;Deconditioning;Shortness of Breath;Balance Concerns  residuals from stroke have altered his gait, cannot feel right foot/leg      6 Minute Walk:     6 Minute Walk    Row Name 02/23/17 1622         6 Minute Walk   Distance 1250 feet     Walk Time 6 minutes     # of Rest Breaks 0     MPH 2.36     METS 3.86     RPE 13     Perceived Dyspnea  2     VO2 Peak 13.5     Symptoms No     Resting HR 63 bpm     Resting BP 138/62     Max Ex. HR 102 bpm     Max Ex. BP 144/58       Interval HR   Baseline HR 63     3 Minute HR 97     6 Minute HR 102     Interval Heart Rate? Yes       Interval Oxygen   Interval Oxygen? Yes     Baseline Oxygen Saturation % 98 %     3 Minute Oxygen Saturation % 98 %     6 Minute Oxygen Saturation % 96 %       Oxygen Initial Assessment:   Oxygen Re-Evaluation:   Oxygen Discharge (Final Oxygen Re-Evaluation):   Initial Exercise Prescription:     Initial Exercise Prescription - 02/23/17 1600      Date of Initial Exercise RX and Referring Provider   Date 02/23/17   Referring Provider Raul Del     Treadmill   Minutes 15     Recumbant Bike   Level 1   RPM 50   Minutes 15     NuStep   Level 3   Minutes 15     Prescription Details   Frequency (times per week) 3   Duration Progress to 30 minutes of continuous aerobic without signs/symptoms of physical distress     Intensity   THRR 40-80%  of Max Heartrate 104-145   Ratings of Perceived Exertion 11-15   Perceived Dyspnea 0-4     Resistance Training   Training  Prescription Yes   Weight 3   Reps 10-15      Perform Capillary Blood Glucose checks as needed.  Exercise Prescription Changes:     Exercise Prescription Changes    Row Name 03/04/17 1400 03/11/17 1300 03/18/17 1500 03/24/17 1500 04/08/17 1400     Response to Exercise   Blood Pressure (Admit) 126/64 118/72  -- 132/70 128/76   Blood Pressure (Exercise) 126/64 142/60  -- 120/70 140/64   Blood Pressure (Exit) 118/64 132/72  -- 132/74 118/60   Heart Rate (Admit) 72 bpm 63 bpm  -- 66 bpm 66 bpm   Heart Rate (Exercise) 84 bpm 127 bpm  -- 77 bpm 99 bpm   Heart Rate (Exit) 95 bpm 67 bpm  -- 67 bpm 73 bpm   Oxygen Saturation (Admit) 100 % 100 %  -- 99 % 99 %   Oxygen Saturation (Exercise) 96 % 97 %  -- 94 % 97 %   Oxygen Saturation (Exit) 95 % 99 %  -- 97 % 97 %   Rating of Perceived Exertion (Exercise) 11 12  -- 10 11   Perceived Dyspnea (Exercise) 3 3  -- 1 1   Symptoms none none none none none   Comments first full day of exercise  --  --  --  --   Duration Progress to 45 minutes of aerobic exercise without signs/symptoms of physical distress Progress to 45 minutes of aerobic exercise without signs/symptoms of physical distress Progress to 45 minutes of aerobic exercise without signs/symptoms of physical distress Continue with 45 min of aerobic exercise without signs/symptoms of physical distress. Continue with 45 min of aerobic exercise without signs/symptoms of physical distress.   Intensity THRR unchanged THRR unchanged THRR unchanged THRR unchanged THRR unchanged     Progression   Progression Continue to progress workloads to maintain intensity without signs/symptoms of physical distress. Continue to progress workloads to maintain intensity without signs/symptoms of physical distress. Continue to progress workloads to maintain intensity without signs/symptoms of physical distress. Continue to progress workloads to maintain intensity without signs/symptoms of physical distress. Continue  to progress workloads to maintain intensity without signs/symptoms of physical distress.   Average METs 2.81 2.98 2.98 2.61 2.64     Resistance Training   Training Prescription Yes Yes Yes Yes Yes   Weight 3 3 lbs 3 lbs 4 lbs 4 lbs   Reps 10-15 10-15 10-15 10-15 10-15     Interval Training   Interval Training No No No No No     Treadmill   MPH 1.7 2.1 2.1 2.3 2.3   Grade 0.5 0.5 0.5 0.5 0.5   Minutes '15 15 15 15 15   '$ METs 2.42 2.75 2.75 2.92 2.92     Recumbant Bike   Level  -- '5 5 5  '$ --   RPM  -- 57 57  --  --   Minutes  -- '15 15 15  '$ --   METs  -- 3 3 2.5  --     NuStep   Level '3 5 5 '$ 45 5   SPM 91 91 91 85 71   Minutes '15 15 15 15 15   '$ METs 3.2 3.2 3.2 3.2 3.3     Recumbant Elliptical   Level  --  --  -- 1.5 1.5   RPM  --  --  --  44 43   Minutes  --  --  -- 15 15   METs  --  --  -- 1.7 1.7     Home Exercise Plan   Plans to continue exercise at  --  -- Home (comment)  walking, purchase equipment or [facility] Home (comment)  walking, purchase equipment or [facility] Home (comment)  walking, purchase equipment or [facility]   Frequency  --  -- Add 1 additional day to program exercise sessions. Add 1 additional day to program exercise sessions. Add 1 additional day to program exercise sessions.   Initial Home Exercises Provided  --  -- 03/18/17 03/18/17 03/18/17      Exercise Comments:     Exercise Comments    Row Name 03/04/17 1407 03/11/17 1346 03/25/17 1421 04/08/17 1451     Exercise Comments First full day of exercise!  Patient was oriented to gym and equipment including functions, settings, policies, and procedures.  Patient's individual exercise prescription and treatment plan were reviewed.  All starting workloads were established based on the results of the 6 minute walk test done at initial orientation visit.  The plan for exercise progression was also introduced and progression will be customized based on patient's performance and goals. Terrence Brown tried the  [employer] today and really liked it.  We will try it in place of the recumbent bike which is hard on his right foot. Terrence Brown tried out the new recumbent bike and it did work better with his right leg/foot.  However, he now likes the recumbent elliptical as it gives him a good workout. Called to see if he had heard from [employer] about getting set up with other gym yet.  Last we talked with Delilah Shan she had left him a message.       Exercise Goals and Review:     Exercise Goals    Row Name 03/04/17 1407             Exercise Goals   Increase Physical Activity Yes       Intervention Provide advice, education, support and counseling about physical activity/exercise needs.;Develop an individualized exercise prescription for aerobic and resistive training based on initial evaluation findings, risk stratification, comorbidities and participant's personal goals.       Expected Outcomes Achievement of increased cardiorespiratory fitness and enhanced flexibility, muscular endurance and strength shown through measurements of functional capacity and personal statement of participant.       Increase Strength and Stamina Yes       Intervention Provide advice, education, support and counseling about physical activity/exercise needs.;Develop an individualized exercise prescription for aerobic and resistive training based on initial evaluation findings, risk stratification, comorbidities and participant's personal goals.       Expected Outcomes Achievement of increased cardiorespiratory fitness and enhanced flexibility, muscular endurance and strength shown through measurements of functional capacity and personal statement of participant.          Exercise Goals Re-Evaluation :     Exercise Goals Re-Evaluation    Row Name 03/11/17 1346 03/18/17 1538 03/24/17 1520 03/25/17 1422 04/08/17 1443     Exercise Goal Re-Evaluation   Exercise Goals Review Increase Strenth and Stamina;Increase Physical  Activity Increase Physical Activity Increase Physical Activity;Increase Strenth and Stamina Increase Physical Activity Increase Physical Activity   Comments Terrence Brown is off to a good start with rehab. He really wants to work hard and is always eager to get going.  He has already increased the NuStep.  We will  continue to monitor his progression. Reviewed home exercise with pt today.  Pt plans to walk at home for exercise.  He is also looking in purchasing a piece of equipment or joining our [facility].  At this point, he is going to try to add in one extra day at home fore 2-3 weeks, then two days at home.  Reviewed THR, pulse, RPE, sign and symptoms, and when to call 911 or MD.  Also discussed weather considerations and indoor options.  Pt voiced understanding. Terrence Brown has been doing well in rehab.  He is now doing the recumbent elliptical in place on the bike and doing well.   We will continue to monitor his progression. Terrence Brown tried out the new recumbent bike and it did work better with his right leg/foot.  However, he now likes the recumbent elliptical as it gives him a good workout. Terrence Brown has been doing well in rehab.  He works very hard each day he is here.  He is up to level 1.5 on the recumbent ellipitcal and 3.3 METs on the NuStep.  We will continue to monitor his progression.   Expected Outcomes Short: Review home exercise guidelines  Long: Continue to work on strength and stamina Short: Add in one day at home.  Long: Become more independent in exercise on his own. Short: Try new bikes to see if he likes them better.  Long: Continue to work on IT sales professional. Continue to work on strength and stamina by increasing workloads and exercise at home. Short: Increase workload on REL again.  Long: Continue to make exercise part of his daily routine.      Discharge Exercise Prescription (Final Exercise Prescription Changes):     Exercise Prescription Changes - 04/08/17 1400      Response to Exercise    Blood Pressure (Admit) 128/76   Blood Pressure (Exercise) 140/64   Blood Pressure (Exit) 118/60   Heart Rate (Admit) 66 bpm   Heart Rate (Exercise) 99 bpm   Heart Rate (Exit) 73 bpm   Oxygen Saturation (Admit) 99 %   Oxygen Saturation (Exercise) 97 %   Oxygen Saturation (Exit) 97 %   Rating of Perceived Exertion (Exercise) 11   Perceived Dyspnea (Exercise) 1   Symptoms none   Duration Continue with 45 min of aerobic exercise without signs/symptoms of physical distress.   Intensity THRR unchanged     Progression   Progression Continue to progress workloads to maintain intensity without signs/symptoms of physical distress.   Average METs 2.64     Resistance Training   Training Prescription Yes   Weight 4 lbs   Reps 10-15     Interval Training   Interval Training No     Treadmill   MPH 2.3   Grade 0.5   Minutes 15   METs 2.92     NuStep   Level 5   SPM 71   Minutes 15   METs 3.3     Recumbant Elliptical   Level 1.5   RPM 43   Minutes 15   METs 1.7     Home Exercise Plan   Plans to continue exercise at Home (comment)  walking, purchase equipment or [facility]   Frequency Add 1 additional day to program exercise sessions.   Initial Home Exercises Provided 03/18/17      Nutrition:  Target Goals: Understanding of nutrition guidelines, daily intake of sodium '1500mg'$ , cholesterol '200mg'$ , calories 30% from fat and 7% or less from saturated fats,  daily to have 5 or more servings of fruits and vegetables.  Biometrics:     Pre Biometrics - 02/23/17 1621      Pre Biometrics   Height 5' 7.25" (1.708 m)   Weight 171 lb 3.2 oz (77.7 kg)   Waist Circumference 40.25 inches   Hip Circumference 40.75 inches   Waist to Hip Ratio 0.99 %   BMI (Calculated) 26.7       Nutrition Therapy Plan and Nutrition Goals:   Nutrition Discharge: Rate Your Plate Scores:   Nutrition Goals Re-Evaluation:   Nutrition Goals Discharge (Final Nutrition Goals  Re-Evaluation):   Psychosocial: Target Goals: Acknowledge presence or absence of significant depression and/or stress, maximize coping skills, provide positive support system. Participant is able to verbalize types and ability to use techniques and skills needed for reducing stress and depression.   Initial Review & Psychosocial Screening:     Initial Psych Review & Screening - 02/23/17 1545      Family Dynamics   Good Support System? 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 Being very athletic and musical with piano and guitar, he is now unable to do these things , he loved doing. Over the years, he has worked through his anger and disappointment . Today he is excited about [facility] and exercising again and working on his balance. His wife has been very supportive.     Barriers   Psychosocial barriers to participate in program The patient should benefit from training in stress management and relaxation.     Screening Interventions   Interventions Yes;Encouraged to exercise;Program counselor consult   Expected Outcomes Short Term goal: Utilizing psychosocial counselor, staff and physician to assist with identification of specific Stressors or current issues interfering with healing process. Setting desired goal for each stressor or current issue identified.;Long Term Goal: Stressors or current issues are controlled or eliminated.;Short Term goal: Identification and review with participant of any Quality of Life or Depression concerns found by scoring the questionnaire.      Quality of Life Scores:     Quality of Life - 02/23/17 1545      Quality of Life Scores   Health/Function Pre 20.19 %   Socioeconomic Pre 21 %   Psych/Spiritual Pre 19.86 %   Family Pre 21 %   GLOBAL Pre 20.34 %      PHQ-9: Recent Review Flowsheet Data    Depression screen [facility] 2/9 02/24/2017   Decreased  Interest 0   Down, Depressed, Hopeless 0   PHQ - 2 Score 0   Altered sleeping 3   Tired, decreased energy 3   Change in appetite 0   Feeling bad or failure about yourself  0   Trouble concentrating 0   Moving slowly or fidgety/restless 0   Suicidal thoughts 0   PHQ-9 Score 6   Difficult doing work/chores Not difficult at all     Interpretation of Total Score  Total Score Depression Severity:  1-4 = Minimal depression, 5-9 = Mild depression, 10-14 = Moderate depression, 15-19 = Moderately severe depression, 20-27 = Severe depression   Psychosocial Evaluation and Intervention:     Psychosocial Evaluation - 03/18/17 1226      Psychosocial Evaluation & Interventions   Interventions Encouraged to exercise with the program and follow exercise prescription;Stress management education;Relaxation education   Comments Counselor met with Mr. Kant Thayer Ohm) today for initial  psychosocial evaluation.  He is a 56 year old who was born with Cystic Fibrosis and had a bilateral lung transplant in 2003.  Terrence Brown has a strong support system with a spouse of 22 years.  He states he has sleep apnea and uses a CPAP machine to help with quality and quantity of sleep.  He denies a history of depression or anxiety ("diagnosed") but admits to being angry and irritable often which could be symptomatic of depression.  Counselor discussed this with Terrence Brown reporting he is managing this well and is not "yelling at spouse - only the TV."  His PHQ-9 scores were indicative of mild depression which is his admission as well.  He reports he generally is in a positive mood and has stress with his health and his "puppy" who is chewing things up; as well as the [employer] baseball team when they lose!  Chris's goals are to improve his core strength and that of his ankles as he is falling more often with an ankle that turns in when he is tired.  He would also like to increase his overall stamina and strength and possibly lose a few pounds.   Counselor and staff will be following with Terrence Brown throughout the course of this program.     Expected Outcomes Terrence Brown will exercise consistently to achieve his stated goals.  He will also attend and participate in the psychoeducational components of this program to help cope with stress better and "yell" less.     Continue Psychosocial Services  Follow up required by staff      Psychosocial Re-Evaluation:   Psychosocial Discharge (Final Psychosocial Re-Evaluation):   Education: Education Goals: Education classes will be provided on a weekly basis, covering required topics. Participant will state understanding/return demonstration of topics presented.  Learning Barriers/Preferences:     Learning Barriers/Preferences - 02/24/17 0617      Learning Barriers/Preferences   Learning Barriers Exercise Concerns;None   Learning Preferences None      Education Topics: Initial Evaluation Education: - Verbal, written and demonstration of respiratory meds, RPE/PD scales, oximetry and breathing techniques. Instruction on use of nebulizers and MDIs: cleaning and proper use, rinsing mouth with steroid doses and importance of monitoring MDI activations.   Pulmonary Rehab from 04/01/2017 in [facility] Cardiac and Pulmonary Rehab  Date  02/23/17  Educator  LB  Instruction Review Code  2- meets goals/outcomes      General Nutrition Guidelines/Fats and Fiber: -Group instruction provided by verbal, written material, models and posters to present the general guidelines for heart healthy nutrition. Gives an explanation and review of dietary fats and fiber.   Controlling Sodium/Reading Food Labels: -Group verbal and written material supporting the discussion of sodium use in heart healthy nutrition. Review and explanation with models, verbal and written materials for utilization of the food label.   Exercise Physiology & Risk Factors: - Group verbal and written instruction with models to review the exercise  physiology of the cardiovascular system and associated critical values. Details cardiovascular disease risk factors and the goals associated with each risk factor.   Pulmonary Rehab from 04/01/2017 in [facility] Cardiac and Pulmonary Rehab  Date  03/06/17  Educator  [place]  Instruction Review Code  2- meets goals/outcomes      Aerobic Exercise & Resistance Training: - Gives group verbal and written discussion on the health impact of inactivity. On the components of aerobic and resistive training programs and the benefits of this training and how to safely progress through these programs.  Flexibility, Balance, General Exercise Guidelines: - Provides group verbal and written instruction on the benefits of flexibility and balance training programs. Provides general exercise guidelines with specific guidelines to those with heart or lung disease. Demonstration and skill practice provided.   Stress Management: - Provides group verbal and written instruction about the health risks of elevated stress, cause of high stress, and healthy ways to reduce stress.   Pulmonary Rehab from 04/01/2017 in [facility] Cardiac and Pulmonary Rehab  Date  03/04/17  Educator  [facility]  Instruction Review Code  2- meets goals/outcomes      Depression: - Provides group verbal and written instruction on the correlation between heart/lung disease and depressed mood, treatment options, and the stigmas associated with seeking treatment.   Exercise & Equipment Safety: - Individual verbal instruction and demonstration of equipment use and safety with use of the equipment.   Pulmonary Rehab from 04/01/2017 in [facility] Cardiac and Pulmonary Rehab  Date  03/04/17  Educator  [facility]  Instruction Review Code  2- meets goals/outcomes      Infection Prevention: - Provides verbal and written material to individual with discussion of infection control including proper hand washing and proper equipment cleaning during exercise session.   Pulmonary  Rehab from 04/01/2017 in [facility] Cardiac and Pulmonary Rehab  Date  02/23/17  Educator  LB  Instruction Review Code  2- meets goals/outcomes      Falls Prevention: - Provides verbal and written material to individual with discussion of falls prevention and safety.   Pulmonary Rehab from 04/01/2017 in [facility] Cardiac and Pulmonary Rehab  Date  02/23/17  Educator  LB  Instruction Review Code  2- meets goals/outcomes      Diabetes: - Individual verbal and written instruction to review signs/symptoms of diabetes, desired ranges of glucose level fasting, after meals and with exercise. Advice that pre and post exercise glucose checks will be done for 3 sessions at entry of program.   Chronic Lung Diseases: - Group verbal and written instruction to review new updates, new respiratory medications, new advancements in procedures and treatments. Provide informative websites and "800" numbers of self-education.   Pulmonary Rehab from 04/01/2017 in [facility] Cardiac and Pulmonary Rehab  Date  03/11/17  Educator  LB  Instruction Review Code  2- meets goals/outcomes      Lung Procedures: - Group verbal and written instruction to describe testing methods done to diagnose lung disease. Review the outcome of test results. Describe the treatment choices: Pulmonary Function Tests, ABGs and oximetry.   Energy Conservation: - Provide group verbal and written instruction for methods to conserve energy, plan and organize activities. Instruct on pacing techniques, use of adaptive equipment and posture/positioning to relieve shortness of breath.   Triggers: - Group verbal and written instruction to review types of environmental controls: home humidity, furnaces, filters, dust mite/pet prevention, HEPA vacuums. To discuss weather changes, air quality and the benefits of nasal washing.   Pulmonary Rehab from 04/01/2017 in [facility] Cardiac and Pulmonary Rehab  Date  03/25/17  Educator  LB  Instruction Review Code  2- meets  goals/outcomes      Exacerbations: - Group verbal and written instruction to provide: warning signs, infection symptoms, calling MD promptly, preventive modes, and value of vaccinations. Review: effective airway clearance, coughing and/or vibration techniques. Create an Sports administrator.   Pulmonary Rehab from 04/01/2017 in [facility] Cardiac and Pulmonary Rehab  Date  03/18/17  Educator  LB  Instruction Review Code  2- meets goals/outcomes  Oxygen: - Individual and group verbal and written instruction on oxygen therapy. Includes supplement oxygen, available portable oxygen systems, continuous and intermittent flow rates, oxygen safety, concentrators, and Medicare reimbursement for oxygen.   Respiratory Medications: - Group verbal and written instruction to review medications for lung disease. Drug class, frequency, complications, importance of spacers, rinsing mouth after steroid MDI's, and proper cleaning methods for nebulizers.   AED/CPR: - Group verbal and written instruction with the use of models to demonstrate the basic use of the AED with the basic ABC's of resuscitation.   Pulmonary Rehab from 04/01/2017 in [facility] Cardiac and Pulmonary Rehab  Date  03/27/17  Educator  [place]  Instruction Review Code  2- meets goals/outcomes      Breathing Retraining: - Provides individuals verbal and written instruction on purpose, frequency, and proper technique of diaphragmatic breathing and pursed-lipped breathing. Applies individual practice skills.   Pulmonary Rehab from 04/01/2017 in [facility] Cardiac and Pulmonary Rehab  Date  02/23/17  Educator  LB  Instruction Review Code  2- meets goals/outcomes      Anatomy and Physiology of the Lungs: - Group verbal and written instruction with the use of models to provide basic lung anatomy and physiology related to function, structure and complications of lung disease.   Heart Failure: - Group verbal and written instruction on the basics of heart failure:  signs/symptoms, treatments, explanation of ejection fraction, enlarged heart and cardiomyopathy.   Sleep Apnea: - Individual verbal and written instruction to review Obstructive Sleep Apnea. Review of risk factors, methods for diagnosing and types of masks and machines for OSA.   Pulmonary Rehab from 04/01/2017 in [facility] Cardiac and Pulmonary Rehab  Date  02/23/17  Educator  LB  Instruction Review Code  2- meets goals/outcomes      Anxiety: - Provides group, verbal and written instruction on the correlation between heart/lung disease and anxiety, treatment options, and management of anxiety.   Pulmonary Rehab from 04/01/2017 in [facility] Cardiac and Pulmonary Rehab  Date  03/04/17  Educator  [facility]  Instruction Review Code  2- Meets goals/outcomes      Relaxation: - Provides group, verbal and written instruction about the benefits of relaxation for patients with heart/lung disease. Also provides patients with examples of relaxation techniques.   Pulmonary Rehab from 04/01/2017 in [facility] Cardiac and Pulmonary Rehab  Date  04/01/17  Educator  [facility]  Instruction Review Code  2- Meets goals/outcomes      Knowledge Questionnaire Score:     Knowledge Questionnaire Score - 02/24/17 0617      Knowledge Questionnaire Score   Pre Score 8/10       Core Components/Risk Factors/Patient Goals at Admission:     Personal Goals and Risk Factors at Admission - 02/23/17 1545      Core Components/Risk Factors/Patient Goals on Admission    Weight Management Yes   Intervention --  Wife cooks very healthy - lots of salads and vegetables   Admit Weight 171 lb 3.2 oz (77.7 kg)   Goal Weight: Short Term 166 lb (75.3 kg)   Goal Weight: Long Term 140 lb (63.5 kg)   Expected Outcomes Short Term: Continue to assess and modify interventions until short term weight is achieved;Long Term: Adherence to nutrition and physical activity/exercise program aimed toward attainment of established weight goal;Weight Maintenance:  Understanding of the daily nutrition guidelines, which includes 25-35% calories from fat, 7% or less cal from saturated fats, less than '200mg'$  cholesterol, less than 1.5gm of sodium, &  5 or more servings of fruits and vegetables daily;Weight Loss: Understanding of general recommendations for a balanced deficit meal plan, which promotes 1-2 lb weight loss per week and includes a negative energy balance of [phone] kcal/d;Understanding recommendations for meals to include 15-35% energy as protein, 25-35% energy from fat, 35-60% energy from carbohydrates, less than '200mg'$  of dietary cholesterol, 20-35 gm of total fiber daily   Improve shortness of breath with ADL's Yes   Intervention Provide education, individualized exercise plan and daily activity instruction to help decrease symptoms of SOB with activities of daily living.   Expected Outcomes Short Term: Achieves a reduction of symptoms when performing activities of daily living.   Develop more efficient breathing techniques such as purse lipped breathing and diaphragmatic breathing; and practicing self-pacing with activity Yes   Intervention Provide education, demonstration and support about specific breathing techniuqes utilized for more efficient breathing. Include techniques such as pursed lipped breathing, diaphragmatic breathing and self-pacing activity.   Expected Outcomes Short Term: Participant will be able to demonstrate and use breathing techniques as needed throughout daily activities.   Increase knowledge of respiratory medications and ability to use respiratory devices properly  Yes   Intervention Provide education and demonstration as needed of appropriate use of medications, inhalers, and oxygen therapy.  Good understanding of CPAP; Uses suto settings.    Expected Outcomes Short Term: Achieves understanding of medications use. Understands that oxygen is a medication prescribed by physician. Demonstrates appropriate use of inhaler and oxygen  therapy.   Lipids Yes   Intervention Provide education and support for participant on nutrition & aerobic/resistive exercise along with prescribed medications to achieve LDL '70mg'$ , HDL >'40mg'$ .   Expected Outcomes Short Term: Participant states understanding of desired cholesterol values and is compliant with medications prescribed. Participant is following exercise prescription and nutrition guidelines.;Long Term: Cholesterol controlled with medications as prescribed, with individualized exercise RX and with personalized nutrition plan. Value goals: LDL < '70mg'$ , HDL > 40 mg.      Core Components/Risk Factors/Patient Goals Review:      Goals and Risk Factor Review    Row Name 03/04/17 1409 03/30/17 0720           Core Components/Risk Factors/Patient Goals Review   Personal Goals Review Develop more efficient breathing techniques such as purse lipped breathing and diaphragmatic breathing and practicing self-pacing with activity. Improve shortness of breath with ADL's;Develop more efficient breathing techniques such as purse lipped breathing and diaphragmatic breathing and practicing self-pacing with activity.;Lipids;Increase knowledge of respiratory medications and ability to use respiratory devices properly.      Review Reviewed how to do pursed lip breathing and when to use it.   Mr Homan is very compliant with his CPAP. After my talk on Triggers, Mr Mcgilvery mentioned how he changes his filteron his CPAP machine every 3 weeks. Using the auto pressure is very comfortable. Mr Hollingsworth is in the process of finding a treatmill that has handles which is safer for him. He is also joining the independent gym for exercise on off days from [place]. He continues to eat salads and vegetables to help control his lipids and loss weight.       Expected Outcomes Short: Terrence Brown will use pursed lip breathing during exercise.  Long: Terrence Brown will become independent in using pursed lip breathing Continue improving his  exercise goals and start exercising at home and in the [facility].         Core Components/Risk Factors/Patient Goals at Discharge (Final Review):  Goals and Risk Factor Review - 03/30/17 0720      Core Components/Risk Factors/Patient Goals Review   Personal Goals Review Improve shortness of breath with ADL's;Develop more efficient breathing techniques such as purse lipped breathing and diaphragmatic breathing and practicing self-pacing with activity.;Lipids;Increase knowledge of respiratory medications and ability to use respiratory devices properly.   Review Mr Edgin is very compliant with his CPAP. After my talk on Triggers, Mr Fuston mentioned how he changes his filteron his CPAP machine every 3 weeks. Using the auto pressure is very comfortable. Mr Crosson is in the process of finding a treatmill that has handles which is safer for him. He is also joining the independent gym for exercise on off days from [place]. He continues to eat salads and vegetables to help control his lipids and loss weight.    Expected Outcomes Continue improving his exercise goals and start exercising at home and in the [facility].      ITP Comments:     ITP Comments    Row Name 03/20/17 1237 04/06/17 0910 04/14/17 1532       ITP Comments Know [employer] Numbers 03/20/17 CE 30 day note review with Dr Emily Filbert, Medical Director of [facility] Mr Dilger to apologize for the missed communication in the set-up of [facility] on off days of [facility]. Mr Knerr waited 5 weeks for [facility] to call and decided to join another gym. He will be discharged from [place] and is also looking to purchase a home treadmill.        Comments: Called Mr Atkerson to apologize for the missed communication in the set-up of [facility] on off days of [facility]. Mr Sally waited 5 weeks for [facility] to call and decided to join another gym. He will be discharged from [place] and is also looking to purchase a home treadmill.

## 2017-04-14 NOTE — Progress Notes (Signed)
Discharge Summary  Patient Details  Name: Terrence Brown MRN: [id number] Date of Birth: 03/26/1961 Referring Provider:     Pulmonary Rehab from 02/23/2017 in [place] and Pulmonary Rehab  Referring Provider  Raul Del       Number of Visits: 10  Reason for Discharge:  Early Exit:  Called Terrence Brown to apologize for the missed communication in the set-up of [facility] on off days of [facility]. Terrence Brown waited 5 weeks for [facility] to call and decided to join another gym. He will be discharged from [place] and is also looking to purchase a home treadmill.  Smoking History:  History  Smoking Status   Never Smoker  Smokeless Tobacco   Never Used    Diagnosis:  Cystic fibrosis ([place])  ADL UCSD:     Pulmonary Assessment Scores    Row Name 02/23/17 0402         ADL UCSD   ADL Phase Entry     SOB Score total 35     Rest 1     Walk 2     Stairs 4     Bath 1     Dress 2     Shop 2        Initial Exercise Prescription:     Initial Exercise Prescription - 02/23/17 1600      Date of Initial Exercise RX and Referring Provider   Date 02/23/17   Referring Provider Raul Del     Treadmill   Minutes 15     Recumbant Bike   Level 1   RPM 50   Minutes 15     NuStep   Level 3   Minutes 15     Prescription Details   Frequency (times per week) 3   Duration Progress to 30 minutes of continuous aerobic without signs/symptoms of physical distress     Intensity   THRR 40-80% of Max Heartrate 104-145   Ratings of Perceived Exertion 11-15   Perceived Dyspnea 0-4     Resistance Training   Training Prescription Yes   Weight 3   Reps 10-15      Discharge Exercise Prescription (Final Exercise Prescription Changes):     Exercise Prescription Changes - 04/08/17 1400      Response to Exercise   Blood Pressure (Admit) 128/76   Blood Pressure (Exercise) 140/64   Blood Pressure (Exit) 118/60   Heart Rate (Admit) 66 bpm   Heart Rate (Exercise) 99 bpm   Heart  Rate (Exit) 73 bpm   Oxygen Saturation (Admit) 99 %   Oxygen Saturation (Exercise) 97 %   Oxygen Saturation (Exit) 97 %   Rating of Perceived Exertion (Exercise) 11   Perceived Dyspnea (Exercise) 1   Symptoms none   Duration Continue with 45 min of aerobic exercise without signs/symptoms of physical distress.   Intensity THRR unchanged     Progression   Progression Continue to progress workloads to maintain intensity without signs/symptoms of physical distress.   Average METs 2.64     Resistance Training   Training Prescription Yes   Weight 4 lbs   Reps 10-15     Interval Training   Interval Training No     Treadmill   MPH 2.3   Grade 0.5   Minutes 15   METs 2.92     NuStep   Level 5   SPM 71   Minutes 15   METs 3.3     Recumbant Elliptical   Level 1.5  RPM 43   Minutes 15   METs 1.7     Home Exercise Plan   Plans to continue exercise at Home (comment)  walking, purchase equipment or [facility]   Frequency Add 1 additional day to program exercise sessions.   Initial Home Exercises Provided 03/18/17      Functional Capacity:     6 Minute Walk    Row Name 02/23/17 1622         6 Minute Walk   Distance 1250 feet     Walk Time 6 minutes     # of Rest Breaks 0     MPH 2.36     METS 3.86     RPE 13     Perceived Dyspnea  2     VO2 Peak 13.5     Symptoms No     Resting HR 63 bpm     Resting BP 138/62     Max Ex. HR 102 bpm     Max Ex. BP 144/58       Interval HR   Baseline HR 63     3 Minute HR 97     6 Minute HR 102     Interval Heart Rate? Yes       Interval Oxygen   Interval Oxygen? Yes     Baseline Oxygen Saturation % 98 %     3 Minute Oxygen Saturation % 98 %     6 Minute Oxygen Saturation % 96 %        Psychological, QOL, Others - Outcomes: PHQ 2/9: Depression screen PHQ 2/9 02/24/2017  Decreased Interest 0  Down, Depressed, Hopeless 0  PHQ - 2 Score 0  Altered sleeping 3  Tired, decreased energy 3  Change in appetite 0   Feeling bad or failure about yourself  0  Trouble concentrating 0  Moving slowly or fidgety/restless 0  Suicidal thoughts 0  PHQ-9 Score 6  Difficult doing work/chores Not difficult at all    Quality of Life:     Quality of Life - 02/23/17 1545      Quality of Life Scores   Health/Function Pre 20.19 %   Socioeconomic Pre 21 %   Psych/Spiritual Pre 19.86 %   Family Pre 21 %   GLOBAL Pre 20.34 %      Personal Goals: Goals established at orientation with interventions provided to work toward goal.     Personal Goals and Risk Factors at Admission - 02/23/17 1545      Core Components/Risk Factors/Patient Goals on Admission    Weight Management Yes   Intervention --  Wife cooks very healthy - lots of salads and vegetables   Admit Weight 171 lb 3.2 oz (77.7 kg)   Goal Weight: Short Term 166 lb (75.3 kg)   Goal Weight: Long Term 140 lb (63.5 kg)   Expected Outcomes Short Term: Continue to assess and modify interventions until short term weight is achieved;Long Term: Adherence to nutrition and physical activity/exercise program aimed toward attainment of established weight goal;Weight Maintenance: Understanding of the daily nutrition guidelines, which includes 25-35% calories from fat, 7% or less cal from saturated fats, less than 200mg  cholesterol, less than 1.5gm of sodium, & 5 or more servings of fruits and vegetables daily;Weight Loss: Understanding of general recommendations for a balanced deficit meal plan, which promotes 1-2 lb weight loss per week and includes a negative energy balance of [phone] kcal/d;Understanding recommendations for meals to include 15-35% energy as protein,  25-35% energy from fat, 35-60% energy from carbohydrates, less than 200mg  of dietary cholesterol, 20-35 gm of total fiber daily   Improve shortness of breath with ADL's Yes   Intervention Provide education, individualized exercise plan and daily activity instruction to help decrease symptoms of SOB with  activities of daily living.   Expected Outcomes Short Term: Achieves a reduction of symptoms when performing activities of daily living.   Develop more efficient breathing techniques such as purse lipped breathing and diaphragmatic breathing; and practicing self-pacing with activity Yes   Intervention Provide education, demonstration and support about specific breathing techniuqes utilized for more efficient breathing. Include techniques such as pursed lipped breathing, diaphragmatic breathing and self-pacing activity.   Expected Outcomes Short Term: Participant will be able to demonstrate and use breathing techniques as needed throughout daily activities.   Increase knowledge of respiratory medications and ability to use respiratory devices properly  Yes   Intervention Provide education and demonstration as needed of appropriate use of medications, inhalers, and oxygen therapy.  Good understanding of CPAP; Uses suto settings.    Expected Outcomes Short Term: Achieves understanding of medications use. Understands that oxygen is a medication prescribed by physician. Demonstrates appropriate use of inhaler and oxygen therapy.   Lipids Yes   Intervention Provide education and support for participant on nutrition & aerobic/resistive exercise along with prescribed medications to achieve LDL 70mg , HDL >40mg .   Expected Outcomes Short Term: Participant states understanding of desired cholesterol values and is compliant with medications prescribed. Participant is following exercise prescription and nutrition guidelines.;Long Term: Cholesterol controlled with medications as prescribed, with individualized exercise RX and with personalized nutrition plan. Value goals: LDL < 70mg , HDL > 40 mg.       Personal Goals Discharge:     Goals and Risk Factor Review    Row Name 03/04/17 1409 03/30/17 0720           Core Components/Risk Factors/Patient Goals Review   Personal Goals Review Develop more efficient  breathing techniques such as purse lipped breathing and diaphragmatic breathing and practicing self-pacing with activity. Improve shortness of breath with ADL's;Develop more efficient breathing techniques such as purse lipped breathing and diaphragmatic breathing and practicing self-pacing with activity.;Lipids;Increase knowledge of respiratory medications and ability to use respiratory devices properly.      Review Reviewed how to do pursed lip breathing and when to use it.   Terrence Brown is very compliant with his CPAP. After my talk on Triggers, Terrence Brown mentioned how he changes his filteron his CPAP machine every 3 weeks. Using the auto pressure is very comfortable. Terrence Brown is in the process of finding a treatmill that has handles which is safer for him. He is also joining the independent gym for exercise on off days from [place]. He continues to eat salads and vegetables to help control his lipids and loss weight.       Expected Outcomes Short: Terrence Brown will use pursed lip breathing during exercise.  Long: Terrence Brown will become independent in using pursed lip breathing Continue improving his exercise goals and start exercising at home and in the [facility].         Nutrition & Weight - Outcomes:     Pre Biometrics - 02/23/17 1621      Pre Biometrics   Height 5' 7.25" (1.708 m)   Weight 171 lb 3.2 oz (77.7 kg)   Waist Circumference 40.25 inches   Hip Circumference 40.75 inches   Waist to Hip Ratio 0.99 %  BMI (Calculated) 26.7       Nutrition:   Nutrition Discharge:   Education Questionnaire Score:     Knowledge Questionnaire Score - 02/24/17 0617      Knowledge Questionnaire Score   Pre Score 8/10      Goals reviewed with patient; copy given to patient.

## 2017-04-14 NOTE — Telephone Encounter (Signed)
Called Terrence Brown to apologize for the missed communication in the set-up of [facility] on off days of [facility]. Terrence Brown waited 5 weeks for [facility] to call and decided to join another gym. He will be discharged from [place] and is also looking to purchase a home treadmill.

## 2017-06-11 DIAGNOSIS — E119 Type 2 diabetes mellitus without complications: Secondary | ICD-10-CM | POA: Diagnosis not present

## 2017-06-11 DIAGNOSIS — Z942 Lung transplant status: Secondary | ICD-10-CM | POA: Diagnosis not present

## 2017-06-11 DIAGNOSIS — R0609 Other forms of dyspnea: Secondary | ICD-10-CM | POA: Diagnosis not present

## 2017-06-25 DIAGNOSIS — G4733 Obstructive sleep apnea (adult) (pediatric): Secondary | ICD-10-CM | POA: Diagnosis not present

## 2017-06-25 DIAGNOSIS — M8589 Other specified disorders of bone density and structure, multiple sites: Secondary | ICD-10-CM | POA: Diagnosis not present

## 2017-06-25 DIAGNOSIS — I10 Essential (primary) hypertension: Secondary | ICD-10-CM | POA: Diagnosis not present

## 2017-07-08 DIAGNOSIS — M8589 Other specified disorders of bone density and structure, multiple sites: Secondary | ICD-10-CM | POA: Diagnosis not present

## 2017-09-01 DIAGNOSIS — E109 Type 1 diabetes mellitus without complications: Secondary | ICD-10-CM | POA: Diagnosis not present

## 2017-09-17 DIAGNOSIS — E109 Type 1 diabetes mellitus without complications: Secondary | ICD-10-CM | POA: Diagnosis not present

## 2017-09-17 DIAGNOSIS — Z942 Lung transplant status: Secondary | ICD-10-CM | POA: Diagnosis not present

## 2017-09-23 DIAGNOSIS — Z23 Encounter for immunization: Secondary | ICD-10-CM | POA: Diagnosis not present

## 2017-09-24 DIAGNOSIS — G4733 Obstructive sleep apnea (adult) (pediatric): Secondary | ICD-10-CM | POA: Diagnosis not present

## 2017-09-24 DIAGNOSIS — I10 Essential (primary) hypertension: Secondary | ICD-10-CM | POA: Diagnosis not present

## 2017-11-04 DIAGNOSIS — G4733 Obstructive sleep apnea (adult) (pediatric): Secondary | ICD-10-CM | POA: Diagnosis not present

## 2017-11-04 DIAGNOSIS — R918 Other nonspecific abnormal finding of lung field: Secondary | ICD-10-CM | POA: Diagnosis not present

## 2017-11-04 DIAGNOSIS — Z942 Lung transplant status: Secondary | ICD-10-CM | POA: Diagnosis not present

## 2017-11-04 DIAGNOSIS — J310 Chronic rhinitis: Secondary | ICD-10-CM | POA: Diagnosis not present

## 2017-12-03 DIAGNOSIS — R05 Cough: Secondary | ICD-10-CM | POA: Diagnosis not present

## 2017-12-03 DIAGNOSIS — Z942 Lung transplant status: Secondary | ICD-10-CM | POA: Diagnosis not present

## 2017-12-03 DIAGNOSIS — R0609 Other forms of dyspnea: Secondary | ICD-10-CM | POA: Diagnosis not present

## 2017-12-03 DIAGNOSIS — R0602 Shortness of breath: Secondary | ICD-10-CM | POA: Diagnosis not present

## 2017-12-17 DIAGNOSIS — E109 Type 1 diabetes mellitus without complications: Secondary | ICD-10-CM | POA: Diagnosis not present

## 2017-12-17 DIAGNOSIS — Z942 Lung transplant status: Secondary | ICD-10-CM | POA: Diagnosis not present

## 2017-12-25 DIAGNOSIS — G4733 Obstructive sleep apnea (adult) (pediatric): Secondary | ICD-10-CM | POA: Diagnosis not present

## 2017-12-25 DIAGNOSIS — I10 Essential (primary) hypertension: Secondary | ICD-10-CM | POA: Diagnosis not present

## 2018-01-14 DIAGNOSIS — G4733 Obstructive sleep apnea (adult) (pediatric): Secondary | ICD-10-CM | POA: Diagnosis not present

## 2018-01-14 DIAGNOSIS — Z942 Lung transplant status: Secondary | ICD-10-CM | POA: Diagnosis not present

## 2018-02-23 DIAGNOSIS — G4733 Obstructive sleep apnea (adult) (pediatric): Secondary | ICD-10-CM | POA: Diagnosis not present

## 2018-03-26 DIAGNOSIS — G4733 Obstructive sleep apnea (adult) (pediatric): Secondary | ICD-10-CM | POA: Diagnosis not present

## 2018-04-25 DIAGNOSIS — G4733 Obstructive sleep apnea (adult) (pediatric): Secondary | ICD-10-CM | POA: Diagnosis not present

## 2018-05-05 DIAGNOSIS — G4733 Obstructive sleep apnea (adult) (pediatric): Secondary | ICD-10-CM | POA: Diagnosis not present

## 2018-05-05 DIAGNOSIS — Z942 Lung transplant status: Secondary | ICD-10-CM | POA: Diagnosis not present

## 2018-05-26 DIAGNOSIS — G4733 Obstructive sleep apnea (adult) (pediatric): Secondary | ICD-10-CM | POA: Diagnosis not present

## 2018-06-25 DIAGNOSIS — G4733 Obstructive sleep apnea (adult) (pediatric): Secondary | ICD-10-CM | POA: Diagnosis not present

## 2018-07-08 DIAGNOSIS — Z8673 Personal history of transient ischemic attack (TIA), and cerebral infarction without residual deficits: Secondary | ICD-10-CM | POA: Diagnosis not present

## 2018-07-08 DIAGNOSIS — Z942 Lung transplant status: Secondary | ICD-10-CM | POA: Diagnosis not present

## 2018-07-08 DIAGNOSIS — I10 Essential (primary) hypertension: Secondary | ICD-10-CM | POA: Diagnosis not present

## 2018-07-08 DIAGNOSIS — G4733 Obstructive sleep apnea (adult) (pediatric): Secondary | ICD-10-CM | POA: Diagnosis not present

## 2018-07-08 DIAGNOSIS — E109 Type 1 diabetes mellitus without complications: Secondary | ICD-10-CM | POA: Diagnosis not present

## 2018-07-20 DIAGNOSIS — I10 Essential (primary) hypertension: Secondary | ICD-10-CM | POA: Diagnosis not present

## 2018-07-20 DIAGNOSIS — G4733 Obstructive sleep apnea (adult) (pediatric): Secondary | ICD-10-CM | POA: Diagnosis not present

## 2018-07-26 DIAGNOSIS — G4733 Obstructive sleep apnea (adult) (pediatric): Secondary | ICD-10-CM | POA: Diagnosis not present

## 2018-07-27 ENCOUNTER — Other Ambulatory Visit: Payer: Self-pay | Admitting: Internal Medicine

## 2018-07-27 DIAGNOSIS — R600 Localized edema: Secondary | ICD-10-CM

## 2018-08-03 ENCOUNTER — Ambulatory Visit
Admission: RE | Admit: 2018-08-03 | Discharge: 2018-08-03 | Disposition: A | Discharge status: Home / Self Care | Payer: 59 | Source: Ambulatory Visit | Attending: Internal Medicine | Admitting: Internal Medicine

## 2018-08-03 DIAGNOSIS — I10 Essential (primary) hypertension: Secondary | ICD-10-CM | POA: Diagnosis not present

## 2018-08-03 DIAGNOSIS — R600 Localized edema: Secondary | ICD-10-CM | POA: Diagnosis not present

## 2018-08-10 DIAGNOSIS — M21371 Foot drop, right foot: Secondary | ICD-10-CM | POA: Diagnosis not present

## 2018-08-10 DIAGNOSIS — R2681 Unsteadiness on feet: Secondary | ICD-10-CM | POA: Diagnosis not present

## 2018-08-19 ENCOUNTER — Emergency Department
Admission: EM | Admit: 2018-08-19 | Discharge: 2018-08-20 | Disposition: A | Discharge status: Home / Self Care | Payer: 59 | Attending: Emergency Medicine | Admitting: Emergency Medicine

## 2018-08-19 ENCOUNTER — Emergency Department: Payer: 59

## 2018-08-19 ENCOUNTER — Other Ambulatory Visit: Payer: Self-pay

## 2018-08-19 DIAGNOSIS — R29818 Other symptoms and signs involving the nervous system: Secondary | ICD-10-CM | POA: Diagnosis not present

## 2018-08-19 DIAGNOSIS — Z794 Long term (current) use of insulin: Secondary | ICD-10-CM | POA: Insufficient documentation

## 2018-08-19 DIAGNOSIS — Y999 Unspecified external cause status: Secondary | ICD-10-CM | POA: Diagnosis not present

## 2018-08-19 DIAGNOSIS — Z7982 Long term (current) use of aspirin: Secondary | ICD-10-CM | POA: Diagnosis not present

## 2018-08-19 DIAGNOSIS — S0101XA Laceration without foreign body of scalp, initial encounter: Secondary | ICD-10-CM | POA: Diagnosis not present

## 2018-08-19 DIAGNOSIS — W010XXA Fall on same level from slipping, tripping and stumbling without subsequent striking against object, initial encounter: Secondary | ICD-10-CM | POA: Diagnosis not present

## 2018-08-19 DIAGNOSIS — Z79899 Other long term (current) drug therapy: Secondary | ICD-10-CM | POA: Insufficient documentation

## 2018-08-19 DIAGNOSIS — Z8673 Personal history of transient ischemic attack (TIA), and cerebral infarction without residual deficits: Secondary | ICD-10-CM | POA: Diagnosis not present

## 2018-08-19 DIAGNOSIS — E11649 Type 2 diabetes mellitus with hypoglycemia without coma: Secondary | ICD-10-CM | POA: Insufficient documentation

## 2018-08-19 DIAGNOSIS — S42031A Displaced fracture of lateral end of right clavicle, initial encounter for closed fracture: Secondary | ICD-10-CM | POA: Diagnosis not present

## 2018-08-19 DIAGNOSIS — Y92000 Kitchen of unspecified non-institutional (private) residence as  the place of occurrence of the external cause: Secondary | ICD-10-CM | POA: Insufficient documentation

## 2018-08-19 DIAGNOSIS — Y93G1 Activity, food preparation and clean up: Secondary | ICD-10-CM | POA: Insufficient documentation

## 2018-08-19 DIAGNOSIS — S42001A Fracture of unspecified part of right clavicle, initial encounter for closed fracture: Secondary | ICD-10-CM

## 2018-08-19 DIAGNOSIS — S0990XA Unspecified injury of head, initial encounter: Secondary | ICD-10-CM | POA: Diagnosis not present

## 2018-08-19 DIAGNOSIS — E162 Hypoglycemia, unspecified: Secondary | ICD-10-CM

## 2018-08-19 DIAGNOSIS — W19XXXA Unspecified fall, initial encounter: Secondary | ICD-10-CM | POA: Diagnosis not present

## 2018-08-19 LAB — CBC WITH DIFFERENTIAL/PLATELET
Basophils Absolute: 0 10*3/uL (ref 0–0.1)
Basophils Relative: 0 %
Eosinophils Absolute: 0 10*3/uL (ref 0–0.7)
Eosinophils Relative: 0 %
HEMATOCRIT: 38.1 % — AB (ref 40.0–52.0)
HEMOGLOBIN: 13.7 g/dL (ref 13.0–18.0)
LYMPHS ABS: 0.7 10*3/uL — AB (ref 1.0–3.6)
LYMPHS PCT: 4 %
MCH: 32 pg (ref 26.0–34.0)
MCHC: 35.9 g/dL (ref 32.0–36.0)
MCV: 89 fL (ref 80.0–100.0)
MONOS PCT: 7 %
Monocytes Absolute: 1.4 10*3/uL — ABNORMAL HIGH (ref 0.2–1.0)
NEUTROS ABS: 17.8 10*3/uL — AB (ref 1.4–6.5)
NEUTROS PCT: 89 %
Platelets: 243 10*3/uL (ref 150–440)
RBC: 4.27 MIL/uL — ABNORMAL LOW (ref 4.40–5.90)
RDW: 12.2 % (ref 11.5–14.5)
WBC: 19.9 10*3/uL — ABNORMAL HIGH (ref 3.8–10.6)

## 2018-08-19 LAB — BASIC METABOLIC PANEL
Anion gap: 13 (ref 5–15)
BUN: 15 mg/dL (ref 6–20)
CALCIUM: 9.4 mg/dL (ref 8.9–10.3)
CHLORIDE: 92 mmol/L — AB (ref 98–111)
CO2: 27 mmol/L (ref 22–32)
Creatinine, Ser: 1.5 mg/dL — ABNORMAL HIGH (ref 0.61–1.24)
GFR calc Af Amer: 58 mL/min — ABNORMAL LOW (ref >60)
GFR, EST NON AFRICAN AMERICAN: 50 mL/min — AB (ref >60)
Glucose, Bld: 79 mg/dL (ref 70–99)
Potassium: 3.2 mmol/L — ABNORMAL LOW (ref 3.5–5.1)
Sodium: 132 mmol/L — ABNORMAL LOW (ref 135–145)

## 2018-08-19 LAB — TROPONIN I

## 2018-08-19 LAB — GLUCOSE, CAPILLARY
GLUCOSE-CAPILLARY: 104 mg/dL — AB (ref 70–99)
GLUCOSE-CAPILLARY: 90 mg/dL (ref 70–99)

## 2018-08-19 MED ORDER — LIDOCAINE-EPINEPHRINE 2 %-1:100000 IJ SOLN
INTRAMUSCULAR | Status: AC
  Filled 2018-08-19: qty 1

## 2018-08-19 MED ORDER — ACETAMINOPHEN 500 MG PO TABS
ORAL_TABLET | ORAL | Status: AC
  Administered 2018-08-19: 1000 mg
  Filled 2018-08-19: qty 2

## 2018-08-19 NOTE — ED Triage Notes (Signed)
Pt to the er for a fall and lac to the head due to hypoglycemia

## 2018-08-19 NOTE — ED Provider Notes (Signed)
[facility] Emergency Department Provider Note ___________________________________________   First MD Initiated Contact with Patient 08/19/18 2127     (approximate)  I have reviewed the triage vital signs and the nursing notes.   HISTORY  Chief Complaint Fall  HPI Terrence Brown is a 57 y.o. male with a history of cystic fibrosis as well as diabetes and TIA who is presenting to the emergency department after fall.  He says that he was preparing his dinner after taking his insulin when he tripped and fell on his kitchen floor.  He says that he does not think he hit his head when he fell the first time but then believes he had a hypoglycemic episode and then hit his head while trying to get up.  Neighbors called EMS after finding on the floor.  He managed to drink Dr. Malachi Bonds before being transported.  In route, he is denying any headache or neck pain.  Says that his last tetanus shot was within the last 5 years.  Says that he thinks that his passing out resulted from a hypoglycemic episode.  EMS reports that he was very diaphoretic on scene when he first arrived.  Patient says that he takes 81 mg of aspirin as his only blood thinner.   Past Medical History:  Diagnosis Date  . Cystic fibrosis ([place])   . Diabetes mellitus without complication ([place])   . Osteopenia   . Sleep apnea   . TIA (transient ischemic attack)     There are no active problems to display for this patient.   Past Surgical History:  Procedure Laterality Date  . COLONOSCOPY N/A 11/09/2015   Procedure: COLONOSCOPY;  Surgeon: Josefine Class, MD;  Location: [facility] ENDOSCOPY;  Service: Endoscopy;  Laterality: N/A;   Per office, patient HAS TO be 1st case - IDDM  . knee arthoscopy Right   . lung transplant Bilateral     Prior to Admission medications   Medication Sig Start Date End Date Taking? Authorizing Provider  alendronate (FOSAMAX) 70 MG tablet Take 70 mg by mouth once a week. Take  with a full glass of water on an empty stomach.    [provider]  aspirin EC 81 MG tablet Take 81 mg by mouth daily.    [provider]  atenolol (TENORMIN) 25 MG tablet Take 25 mg by mouth daily.    [provider]  azaTHIOprine (IMURAN) 50 MG tablet Take 50 mg by mouth daily.    [provider]  calcium carbonate (TUMS - DOSED IN MG ELEMENTAL CALCIUM) 500 MG chewable tablet Chew 1 tablet by mouth daily.    [provider]  cephALEXin (KEFLEX) 500 MG capsule Take 1 capsule (500 mg total) by mouth 3 (three) times daily. 08/08/16   Hagler, Jami L, PA-C  ferrous sulfate 325 (65 FE) MG tablet Take 325 mg by mouth daily with breakfast.    [provider]  insulin aspart (NOVOLOG) 100 UNIT/ML injection Inject into the skin 3 (three) times daily before meals.    [provider]  insulin lispro (HUMALOG) 100 UNIT/ML injection Inject into the skin 3 (three) times daily before meals.    [provider]  insulin NPH Human (HUMULIN N,NOVOLIN N) 100 UNIT/ML injection Inject 40 Units into the skin daily before breakfast.    [provider]  insulin regular (NOVOLIN R,HUMULIN R) 100 units/mL injection Inject 0-10 Units into the skin 3 (three) times daily before meals.  [provider]  lipase/protease/amylase (CREON) 12000 UNITS CPEP capsule Take 12,000 Units by mouth.    [provider]  magnesium oxide (MAG-OX) 400 MG tablet Take 400 mg by mouth daily.    [provider]  Multiple Vitamin (MULTIVITAMIN) capsule Take 1 capsule by mouth daily.    [provider]  nystatin (MYCOSTATIN) 100000 UNITS/ML SUSP Take 0.5 mLs by mouth every 6 (six) hours.    [provider]  predniSONE (DELTASONE) 10 MG tablet Take 10 mg by mouth daily with breakfast.    [provider]  tacrolimus (PROGRAF) 1 MG capsule Take 1 mg by mouth 2 (two) times daily.    [provider]  traMADol  (ULTRAM) 50 MG tablet Take 50 mg by mouth every 6 (six) hours as needed.    [provider]  tretinoin (RETIN-A) 0.01 % gel Apply topically at bedtime.    [provider]  zolpidem (AMBIEN) 5 MG tablet Take 5 mg by mouth at bedtime as needed for sleep.    [provider]    Allergies Percocet [oxycodone-acetaminophen] and Sulfa antibiotics  No family history on file.  Social History Social History   Tobacco Use  . Smoking status: Never Smoker  . Smokeless tobacco: Never Used  Substance Use Topics  . Alcohol use: No  . Drug use: No    Review of Systems  Constitutional: No fever/chills Eyes: No visual changes. ENT: No sore throat. Cardiovascular: Denies chest pain. Respiratory: Denies shortness of breath. Gastrointestinal: No abdominal pain.  No nausea, no vomiting.  No diarrhea.  No constipation. Genitourinary: Negative for dysuria. Musculoskeletal: Negative for back pain. Skin: Negative for rash. Neurological: Negative for headaches, focal weakness or numbness.  ____________________________________________   PHYSICAL EXAM:  VITAL SIGNS: ED Triage Vitals  Enc Vitals Group     BP 08/19/18 2131 (!) 143/86     Pulse Rate 08/19/18 2131 82     Resp 08/19/18 2131 16     Temp 08/19/18 2131 98.2 F (36.8 C)     Temp Source 08/19/18 2131 Oral     SpO2 08/19/18 2131 97 %     Weight 08/19/18 2132 163 lb (73.9 kg)     Height 08/19/18 2132 5' 6.5" (1.689 m)     Head Circumference --      Peak Flow --      Pain Score 08/19/18 2132 6     Pain Loc --      Pain Edu? --      Excl. in [place]? --     Constitutional: Alert and oriented. Well appearing and in no acute distress. Eyes: Conjunctivae are normal.  Head: 4 cm laceration to the right occipital region on the right side.  No active bleeding at this time.  Depth of approximately 3 mm. Nose: No congestion/rhinnorhea. Mouth/Throat: Mucous membranes are moist.  Neck: No stridor.  No tenderness to  palpation of the posterior cervical spine.  No tenderness to midline, deformity or step-off. Cardiovascular: Normal rate, regular rhythm. Grossly normal heart sounds.   Respiratory: Normal respiratory effort.  No retractions. Lungs CTAB. Gastrointestinal: Soft and nontender. No distention.  Musculoskeletal: No lower extremity tenderness nor edema.  No joint effusions.  No tenderness over the chest wall. Neurologic:  Normal speech and language. No gross focal neurologic deficits are appreciated. Skin:  Skin is warm, dry and intact. No rash noted. Psychiatric: Mood and affect are normal. Speech and behavior are normal.  ____________________________________________   LABS (all  labs ordered are listed, but only abnormal results are displayed)  Labs Reviewed  CBC WITH DIFFERENTIAL/PLATELET - Abnormal; Notable for the following components:      Result Value   WBC 19.9 (*)    RBC 4.27 (*)    HCT 38.1 (*)    Neutro Abs 17.8 (*)    Lymphs Abs 0.7 (*)    Monocytes Absolute 1.4 (*)    All other components within normal limits  BASIC METABOLIC PANEL - Abnormal; Notable for the following components:   Sodium 132 (*)    Potassium 3.2 (*)    Chloride 92 (*)    Creatinine, Ser 1.50 (*)    GFR calc non Af Amer 50 (*)    GFR calc Af Amer 58 (*)    All other components within normal limits  TROPONIN I  GLUCOSE, CAPILLARY  CBG MONITORING, ED  CBG MONITORING, ED  CBG MONITORING, ED  CBG MONITORING, ED   ____________________________________________  EKG  ED ECG REPORT I, Doran Stabler, the attending physician, personally viewed and interpreted this ECG.   Date: 08/19/2018  EKG Time: 2203  Rate: 77  Rhythm: normal sinus rhythm  Axis: Normal  Intervals:none  ST&T Change: No ST segment elevation or depression.  No abnormal T wave inversion.  ____________________________________________  RADIOLOGY  No acute finding on the CT of the  head. ____________________________________________   PROCEDURES  Procedure(s) performed:    Marland KitchenMarland KitchenLaceration Repair Date/Time: 08/19/2018 10:03 PM Performed by: Orbie Pyo, MD Authorized by: Orbie Pyo, MD   Consent:    Consent obtained:  Verbal   Consent given by:  Patient   Risks discussed:  Infection, pain, retained foreign body, poor cosmetic result and poor wound healing Anesthesia (see MAR for exact dosages):    Anesthesia method:  Local infiltration   Local anesthetic:  Lidocaine 1% WITH epi Laceration details:    Location:  Scalp   Scalp location:  Occipital   Length (cm):  4   Depth (mm):  3 Repair type:    Repair type:  Simple Exploration:    Hemostasis achieved with:  Direct pressure   Wound exploration: entire depth of wound probed and visualized     Contaminated: no   Treatment:    Area cleansed with:  Saline   Amount of cleaning:  Extensive   Irrigation solution:  Sterile saline   Irrigation method:  Pressure wash   Visualized foreign bodies/material removed: no   Skin repair:    Repair method:  Staples   Number of staples:  3 Approximation:    Approximation:  Close Post-procedure details:    Dressing:  Sterile dressing   Patient tolerance of procedure:  Tolerated well, no immediate complications    Critical Care performed:   ____________________________________________   INITIAL IMPRESSION / ASSESSMENT AND PLAN / ED COURSE  Pertinent labs & imaging results that were available during my care of the patient were reviewed by me and considered in my medical decision making (see chart for details).  DDX: Mechanical fall, hypoglycemia, syncope, intrarenal hemorrhage, skull fracture, scalp laceration As part of my medical decision making, I reviewed the following data within the [place] Notes from prior ED visits   ----------------------------------------- 11:00 PM on  08/19/2018 -----------------------------------------  Patient continues to be awake and alert.  Eating.  Patient signed out to Dr. Mable Paris will follow up with the patient's CBGs and discharge as long as the patient remains without complaint and without hypoglycemia.  Elevated white blood cell count, likely attributable to likely then prior to arrival.  No obvious infectious source per history and physical. ____________________________________________   FINAL CLINICAL IMPRESSION(S) / ED DIAGNOSES  Mechanical fall.  Scalp laceration, hypoglycemia.  NEW MEDICATIONS STARTED DURING THIS VISIT:  New Prescriptions   No medications on file     Note:  This document was prepared using Dragon voice recognition software and may include unintentional dictation errors.     Orbie Pyo, MD 08/19/18 [phone]

## 2018-08-19 NOTE — ED Notes (Signed)
Got pt up to go to the bathroom. Pt states he is unable to keep his balance at this time. Pt normally has an altered gait but not as bad as now. Pt c/o cramp in the right leg. Pt wanting his blood sugar checked again. Will continue to monitor.

## 2018-08-19 NOTE — ED Notes (Signed)
Patient is resting comfortably. 

## 2018-08-20 ENCOUNTER — Emergency Department: Payer: 59

## 2018-08-20 DIAGNOSIS — S42031A Displaced fracture of lateral end of right clavicle, initial encounter for closed fracture: Secondary | ICD-10-CM | POA: Diagnosis not present

## 2018-08-20 DIAGNOSIS — R29818 Other symptoms and signs involving the nervous system: Secondary | ICD-10-CM | POA: Diagnosis not present

## 2018-08-20 DIAGNOSIS — S0101XA Laceration without foreign body of scalp, initial encounter: Secondary | ICD-10-CM | POA: Diagnosis not present

## 2018-08-20 LAB — GLUCOSE, CAPILLARY
GLUCOSE-CAPILLARY: 92 mg/dL (ref 70–99)
Glucose-Capillary: 68 mg/dL — ABNORMAL LOW (ref 70–99)
Glucose-Capillary: 125 mg/dL — ABNORMAL HIGH (ref 70–99)
Glucose-Capillary: 115 mg/dL — ABNORMAL HIGH (ref 70–99)
Glucose-Capillary: 251 mg/dL — ABNORMAL HIGH (ref 70–99)

## 2018-08-20 MED ORDER — PREDNISONE 10 MG PO TABS
10.0000 mg | ORAL_TABLET | Freq: Every day | ORAL | Status: DC
  Filled 2018-08-20: qty 1

## 2018-08-20 MED ORDER — TACROLIMUS 1 MG PO CAPS
3.0000 mg | ORAL_CAPSULE | Freq: Two times a day (BID) | ORAL | Status: DC
  Administered 2018-08-20: 3 mg via ORAL
  Filled 2018-08-20 (×2): qty 3

## 2018-08-20 MED ORDER — TACROLIMUS 1 MG PO CAPS
1.0000 mg | ORAL_CAPSULE | Freq: Two times a day (BID) | ORAL | Status: DC
  Filled 2018-08-20 (×3): qty 1

## 2018-08-20 MED ORDER — ASPIRIN EC 81 MG PO TBEC
81.0000 mg | DELAYED_RELEASE_TABLET | Freq: Every day | ORAL | Status: DC
  Administered 2018-08-20: 81 mg via ORAL
  Filled 2018-08-20: qty 1

## 2018-08-20 MED ORDER — AZATHIOPRINE 50 MG PO TABS
50.0000 mg | ORAL_TABLET | Freq: Every day | ORAL | Status: DC
  Filled 2018-08-20 (×2): qty 1

## 2018-08-20 MED ORDER — FERROUS SULFATE 325 (65 FE) MG PO TABS
325.0000 mg | ORAL_TABLET | Freq: Every day | ORAL | Status: DC
  Administered 2018-08-20: 325 mg via ORAL
  Filled 2018-08-20 (×2): qty 1

## 2018-08-20 MED ORDER — ALENDRONATE SODIUM 70 MG PO TABS: 70.0000 mg | ORAL_TABLET | ORAL | Status: DC

## 2018-08-20 NOTE — Progress Notes (Signed)
LCSW met with patient and listened attentively to his concerns. Patient will return home despite PT recommendations of SNF. Patient will dc home. His neighbors will be doing daily frequent  Wellness checks and his wife returns on Monday. He will attend his follow up appointment with Dr Jens Som at the [facility] on Thursday of next week.  LCSW completed assessment and requested a care manager to obtain a walker for this patient.   [employer] LCSW [phone]

## 2018-08-20 NOTE — Clinical Social Work Note (Signed)
Clinical Social Work Assessment  Patient Details  Name: Terrence Brown MRN: [id number] Date of Birth: 12-04-1960  Date of referral:  08/20/18               Reason for consult:  Facility Placement, Care Management Concerns, Discharge Planning                Permission sought to share information with:  Family Supports Permission granted to share information::  No  Name::        Agency::     Relationship::     Contact Information:     Housing/Transportation Living arrangements for the past 2 months:  Single Family Home Source of Information:  Patient Patient Interpreter Needed:  None Criminal Activity/Legal Involvement Pertinent to Current Situation/Hospitalization:  No - Comment as needed Significant Relationships:  Neighbor, Spouse Lives with:  Spouse Do you feel safe going back to the place where you live?  Yes Need for family participation in patient care:  No (Coment)  Care giving concerns: Patient reports he is well enough to leave with neighbors doing daily checks on him till his wife returns   Education officer, museum assessment / plan: LCSW introduced myself to patient. He reports he wishes to return home and not go to SNF He is oriented x4 and was apparently hostile to other staff earlier today. Patient was complaining and this provider listened to patient. He was agreeable to have some inhome safety measures and will come back if he is unwell.LCSW met with patient and listened attentively to his concerns. Patient will return home despite PT recommendations of SNF. Patient will dc home. His neighbors will be doing daily frequent  Wellness checks and his wife returns on Monday. He will attend his follow up appointment with Dr Jens Som at the [facility] on Thursday of next week. Patient has requested a care management consult to obtain a walker.  LCSW completed assessment and requested a care manager awaitng a consult  Employment status:  Retired Forensic scientist:  Other  (Comment Required)([employer]) PT Recommendations:  Skilled Nursing Facility(Patient refused to go to SNF and would like to have in home health and equiptment) Information / Referral to community resources:  [place]  Patient/Family's Response to care:  Unable to be reached ( wife is away)  Patient/Family's Understanding of and Emotional Response to Diagnosis, Current Treatment, and Prognosis: Patient is very well aware of his medical conditions and his needs and his capabilities.  Emotional Assessment Appearance:  Appears stated age Attitude/Demeanor/Rapport:  Complaining, Reactive Affect (typically observed):  Anxious, Agitated, Calm, Accepting Orientation:  Oriented to Self, Oriented to Place, Oriented to  Time, Oriented to Situation, Fluctuating Orientation (Suspected and/or reported Sundowners) Alcohol / Substance use:  Not Applicable Psych involvement (Current and /or in the community):  No (Comment)  Discharge Needs  Concerns to be addressed:  Patient refuses services, Care Coordination Readmission within the last 30 days:  No Current discharge risk:  None Barriers to Discharge:   None   Shalena Ezzell M, LCSW 08/20/2018, 12:12 PM

## 2018-08-20 NOTE — ED Provider Notes (Signed)
Patient was seen by physical therapy and social work.  Sickle therapy did recommend SNF.  Social work discussed with the patient.  He apparently adamantly refused SNF placement.  Patient will be given a walker here.   Nance Pear, MD 08/20/18 [phone]

## 2018-08-20 NOTE — ED Notes (Signed)
Pt noted to be walking down hallway with visitors. Pt states he is leaving now that his ride is here. IV d/c'd. Discharge instructions reviewed with patient. Pt refused to allow staff to place arm sling because he wants to use rolling walker to leave. Sling given to pt to take home. Pt ambulated to exit using RW without difficulty.   Pt requests this RN apologize to pt's primary RN for his yelling and swearing at her. Pt informed message will be passed along.

## 2018-08-20 NOTE — ED Notes (Signed)
Pt ambulatory to restroom with this nurse. Assisted back into bed. Pt unsteady on feet, needs assistance to stand and walk.

## 2018-08-20 NOTE — Evaluation (Signed)
Physical Therapy Evaluation Patient Details Name: Terrence Brown MRN: [id number] DOB: [birth year] Today's Date: 08/20/2018   History of Present Illness  presented to ER status post fall with scalp laceration, R shoulder injury (comminuted fracture of distal clavicle).  Clinical Impression  Upon evaluation, patient alert and oriented; follows commands and participates well with session. Min cuing/encouragement for compliance with medical recommendations to R UE (NWB, immobilized).  Baseline R hemiparesis evident with mild coordination deficits L UE (history of previous CVAs); patient feels current status/ability is worsened from baseline.  Currently requiring close sup for bed mobility; min assist +1-2 for sit/stand, basic transfers and gait (10-25') with L loftstrand crutch.  Poor dynamic balance in all planes, requiring physical assist at all times to prevent overt LOB/fall.  Patient exceptionally high risk for falls; unsafe to return home alone at this time.  Unable to manage entry/exit of home, household distances or ADLs without physical assist from helper. Of note, patient continually insisting on trial of RW despite education about risks and implications of non-compliance.  Feels he can use RW "for balance only" without damaging shoulder.  Agreeable to hold on RW for this session pending further discussion with physician; anticipate non-compliance with use of unilateral device in the future. Would benefit from skilled PT to address above deficits and promote optimal return to PLOF; recommend transition to STR upon discharge from acute hospitalization.     Follow Up Recommendations SNF    Equipment Recommendations  (loftstrand)    Recommendations for Other Services       Precautions / Restrictions Precautions Precautions: Fall Precaution Comments: patient generally non-compliant with NWB, immobilization of R UE Restrictions Weight Bearing Restrictions: Yes RUE Weight Bearing: Non  weight bearing      Mobility  Bed Mobility Overal bed mobility: Needs Assistance Bed Mobility: Supine to Sit;Sit to Supine     Supine to sit: Supervision Sit to supine: Supervision   General bed mobility comments: constant cuing for NWB, immobilization of R UE  Transfers Overall transfer level: Needs assistance Equipment used: Lofstrands Transfers: Sit to/from Stand Sit to Stand: Min assist         General transfer comment: assist for lift off, anterior weight translation and standing balance  Ambulation/Gait Ambulation/Gait assistance: Min assist;+2 physical assistance Gait Distance (Feet): (10' x1, 25' x1) Assistive device: Lofstrands       General Gait Details: step to gait pattern, poor balance in all planes requiring constant physical assist from therapist to prevent LOB/fall.  R LE foot drop, steppage gait and genu recurvatum noted throughout gait cycle.  Very slow and deliberate, poor balance/safety  Stairs            Wheelchair Mobility    Modified Rankin (Stroke Patients Only)       Balance Overall balance assessment: Needs assistance Sitting-balance support: No upper extremity supported;Feet supported Sitting balance-Leahy Scale: Good     Standing balance support: Single extremity supported Standing balance-Leahy Scale: Poor                               Pertinent Vitals/Pain Pain Assessment: Faces Faces Pain Scale: Hurts little more Pain Location: R shoulder, R hip Pain Descriptors / Indicators: Aching;Grimacing;Guarding Pain Intervention(s): Limited activity within patient's tolerance;Monitored during session;Repositioned    Home Living Family/patient expects to be discharged to:: Private residence Living Arrangements: Spouse/significant other Available Help at Discharge: Family Type of Home: House Home Access: Stairs to  enter Entrance Stairs-Rails: Left Entrance Stairs-Number of Steps: 6 Home Layout: One level Home  Equipment: Cane - single point Additional Comments: Wife currently out of the country until Monday    Prior Function Level of Independence: Independent with assistive device(s)         Comments: Mod indep with SPC for ADLs, household mobilization     Hand Dominance   Dominant Hand: Right    Extremity/Trunk Assessment   Upper Extremity Assessment Upper Extremity Assessment: (R UE immobilized, maintained NWB as able; L LE grossly 4/5, mild coordination deficits appreciated)    Lower Extremity Assessment Lower Extremity Assessment: (R LE grossly 4-/5, except R ankle 3-/5 (lacking approx 5-8 degrees passive DF); L LE grossly 4/5 )       Communication   Communication: No difficulties  Cognition Arousal/Alertness: Awake/alert Behavior During Therapy: WFL for tasks assessed/performed Overall Cognitive Status: Within Functional Limits for tasks assessed                                        General Comments      Exercises Other Exercises Other Exercises: Extensive education re: R shoulder precautions (immobilization, NWB). Patient voices understanding, but continually insists on trial of RW despite recommendations.  Agreeable to hold for this session until further discussed with physician.  Anticipate poor compliance with recommendations for loftstrand in L UE to maintain R UE precautions.   Assessment/Plan    PT Assessment Patient needs continued PT services  PT Problem List Decreased strength;Decreased range of motion;Decreased activity tolerance;Decreased balance;Decreased mobility;Decreased coordination;Decreased cognition;Decreased knowledge of use of DME;Decreased safety awareness;Decreased knowledge of precautions;Cardiopulmonary status limiting activity;Pain       PT Treatment Interventions DME instruction;Gait training;Stair training;Functional mobility training;Therapeutic activities;Therapeutic exercise;Balance training;Cognitive  remediation;Patient/family education;Neuromuscular re-education    PT Goals (Current goals can be found in the Care Plan section)  Acute Rehab PT Goals Patient Stated Goal: to use a RW PT Goal Formulation: With patient Time For Goal Achievement: 09/03/18 Potential to Achieve Goals: Fair    Frequency Min 2X/week   Barriers to discharge Decreased caregiver support      Co-evaluation               AM-PAC PT "6 Clicks" Daily Activity  Outcome Measure Difficulty turning over in bed (including adjusting bedclothes, sheets and blankets)?: A Little Difficulty moving from lying on back to sitting on the side of the bed? : A Little Difficulty sitting down on and standing up from a chair with arms (e.g., wheelchair, bedside commode, etc,.)?: Unable Help needed moving to and from a bed to chair (including a wheelchair)?: A Lot Help needed walking in hospital room?: A Lot Help needed climbing 3-5 steps with a railing? : Total 6 Click Score: 12    End of Session Equipment Utilized During Treatment: Gait belt Activity Tolerance: Patient tolerated treatment well Patient left: in bed;with call bell/phone within reach Nurse Communication: Mobility status PT Visit Diagnosis: Unsteadiness on feet (R26.81);Muscle weakness (generalized) (M62.81);Difficulty in walking, not elsewhere classified (R26.2);Pain Pain - Right/Left: Right Pain - part of body: Arm    Time: 7824-2353 PT Time Calculation (min) (ACUTE ONLY): 29 min   Charges:   PT Evaluation $PT Eval Moderate Complexity: 1 Mod PT Treatments $Gait Training: 8-22 mins       Kaliana Albino H. Owens Shark, PT, DPT, NCS 08/20/18, 10:22 AM [phone]

## 2018-08-20 NOTE — ED Notes (Signed)
Dr. Goodman at bedside.  

## 2018-08-20 NOTE — ED Notes (Signed)
Pt is requesting accu-check. Dr. Mable Paris made aware.

## 2018-08-20 NOTE — ED Notes (Signed)
Medication received from tube station from pharmacy at this time.

## 2018-08-20 NOTE — ED Notes (Signed)
Patient is resting comfortably. 

## 2018-08-20 NOTE — ED Notes (Signed)
Pt cursing at this nurse regarding his PROGRAF, pt informed the medication is timed for 1000 today and that it can be given an hour before at 0900. Pt screaming and cursing that he needs to take this medication exactly at 0830. Pt screams "it is a fucking time schedule that I have to follow". Pt educated that this type of language and verbal abuse will not be tolerated. Pt requested this nurse to leave. Attempted to administer the predisone that is due at this time and pt refused. Will attempt to administer PROGRAF at scheduled time.

## 2018-08-20 NOTE — ED Notes (Signed)
Pt states, "I am still not taking the prednisone, you don't have it correct. I don't know why I waste my time talking to yall when you don't listen."

## 2018-08-20 NOTE — ED Notes (Signed)
Pt updated that the pharmacy will send additional doses of PROGRAF.

## 2018-08-20 NOTE — ED Notes (Signed)

## 2018-08-20 NOTE — ED Notes (Signed)
Pt assisted to bathroom, very unsteady requiring assistance to remain balanced.

## 2018-08-20 NOTE — ED Notes (Signed)
PT calls out and states his blood sugar is low. Check reveals BG of 68. Pt declines juice and wants soda. Sharren Bridge given and MD notified. Pt asked what plan of care was. Pt has stated to multiple staff he is unable to walk so we were holding pt for consult tomorrow. Pt states he wants to go home. Told pt his blood sugar has not bottomed and has been regular since his arrival. Pt becomes argumentative and states that he did not have dinner and the dinner we gave him had no carbs. Pt was given a [place] sandwich, applesauce and graham crackers and finished the entire plate. Began to ask pt about his doctor for diabetes management and his dosages and pt became defensive and told this RN he didn't want to debate his diabetes or his medication. Pt does not want to answer any questions. Behavior is completely different from intial arrival.

## 2018-08-20 NOTE — ED Provider Notes (Addendum)
Care signed over from Dr. Clearnce Hasten.  Briefly the patient came to the emergency department after feeling lightheaded and having a mechanical fall.  He was felt to be attributed to hypoglycemia as he took his insulin without eating.  Lab work reassuring as well as neuroimaging.  He does have a clavicle fracture on the right.  He has had multiple previous strokes and is right-hand dominant and is unable to walk with his usual cane secondary to the pain.  His wife is currently in [place] and he has known to care for him at home.  He is newly unsteady on his feet after a fall even though his blood sugar is been largely normal.  This risk worked.  He agrees to stay for PT/social work consultation in the morning however I do think is reasonable to MRI his brain in the meantime.   Darel Hong, MD 08/20/18 [phone]   MRI is back showing no acute intracranial pathology.  He remained stable for PT and social work consultations in the morning.   Darel Hong, MD 08/20/18 [phone]

## 2018-08-20 NOTE — ED Notes (Signed)
Blood glucose 251

## 2018-08-20 NOTE — ED Notes (Signed)
Dr Mable Paris in with pt.

## 2018-08-20 NOTE — ED Notes (Signed)
Dr. Archie Balboa aware of pts current blood glucose.

## 2018-08-20 NOTE — ED Notes (Signed)
Pt is assisted to the toilet in room with this nurse and John,RN

## 2018-08-26 DIAGNOSIS — G4733 Obstructive sleep apnea (adult) (pediatric): Secondary | ICD-10-CM | POA: Diagnosis not present

## 2018-08-28 DIAGNOSIS — Z4802 Encounter for removal of sutures: Secondary | ICD-10-CM | POA: Diagnosis not present

## 2018-09-01 ENCOUNTER — Ambulatory Visit: Payer: 59 | Admitting: Physical Therapy

## 2018-09-06 ENCOUNTER — Ambulatory Visit: Payer: 59 | Admitting: Physical Therapy

## 2018-09-08 ENCOUNTER — Encounter: Payer: Medicare Other | Admitting: Physical Therapy

## 2018-09-13 ENCOUNTER — Encounter: Payer: Medicare Other | Admitting: Physical Therapy

## 2018-09-15 ENCOUNTER — Encounter: Payer: Medicare Other | Admitting: Physical Therapy

## 2018-09-20 ENCOUNTER — Encounter: Payer: Medicare Other | Admitting: Physical Therapy

## 2018-09-22 ENCOUNTER — Encounter: Payer: Medicare Other | Admitting: Physical Therapy

## 2018-09-25 DIAGNOSIS — G4733 Obstructive sleep apnea (adult) (pediatric): Secondary | ICD-10-CM | POA: Diagnosis not present

## 2018-09-26 DIAGNOSIS — Z23 Encounter for immunization: Secondary | ICD-10-CM | POA: Diagnosis not present

## 2018-09-27 ENCOUNTER — Encounter: Payer: Medicare Other | Admitting: Physical Therapy

## 2018-09-29 ENCOUNTER — Encounter: Payer: Medicare Other | Admitting: Physical Therapy

## 2018-10-19 DIAGNOSIS — Z8673 Personal history of transient ischemic attack (TIA), and cerebral infarction without residual deficits: Secondary | ICD-10-CM | POA: Diagnosis not present

## 2018-10-19 DIAGNOSIS — Z942 Lung transplant status: Secondary | ICD-10-CM | POA: Diagnosis not present

## 2018-10-19 DIAGNOSIS — E109 Type 1 diabetes mellitus without complications: Secondary | ICD-10-CM | POA: Diagnosis not present

## 2018-10-26 DIAGNOSIS — G4733 Obstructive sleep apnea (adult) (pediatric): Secondary | ICD-10-CM | POA: Diagnosis not present

## 2018-10-26 DIAGNOSIS — E109 Type 1 diabetes mellitus without complications: Secondary | ICD-10-CM | POA: Diagnosis not present

## 2018-10-26 DIAGNOSIS — I10 Essential (primary) hypertension: Secondary | ICD-10-CM | POA: Diagnosis not present

## 2018-10-26 DIAGNOSIS — S42031A Displaced fracture of lateral end of right clavicle, initial encounter for closed fracture: Secondary | ICD-10-CM | POA: Diagnosis not present

## 2018-11-10 DIAGNOSIS — G4733 Obstructive sleep apnea (adult) (pediatric): Secondary | ICD-10-CM | POA: Diagnosis not present

## 2018-11-10 DIAGNOSIS — Z942 Lung transplant status: Secondary | ICD-10-CM | POA: Diagnosis not present

## 2018-11-10 DIAGNOSIS — R270 Ataxia, unspecified: Secondary | ICD-10-CM | POA: Diagnosis not present

## 2018-11-12 DIAGNOSIS — S42034K Nondisplaced fracture of lateral end of right clavicle, subsequent encounter for fracture with nonunion: Secondary | ICD-10-CM | POA: Diagnosis not present

## 2018-11-19 DIAGNOSIS — S42034K Nondisplaced fracture of lateral end of right clavicle, subsequent encounter for fracture with nonunion: Secondary | ICD-10-CM | POA: Diagnosis not present

## 2018-11-25 DIAGNOSIS — G4733 Obstructive sleep apnea (adult) (pediatric): Secondary | ICD-10-CM | POA: Diagnosis not present

## 2018-11-30 DIAGNOSIS — S42034K Nondisplaced fracture of lateral end of right clavicle, subsequent encounter for fracture with nonunion: Secondary | ICD-10-CM | POA: Diagnosis not present

## 2018-12-22 DIAGNOSIS — S42034G Nondisplaced fracture of lateral end of right clavicle, subsequent encounter for fracture with delayed healing: Secondary | ICD-10-CM | POA: Diagnosis not present

## 2018-12-22 DIAGNOSIS — S42034K Nondisplaced fracture of lateral end of right clavicle, subsequent encounter for fracture with nonunion: Secondary | ICD-10-CM | POA: Diagnosis not present

## 2019-01-19 DIAGNOSIS — S42034G Nondisplaced fracture of lateral end of right clavicle, subsequent encounter for fracture with delayed healing: Secondary | ICD-10-CM | POA: Diagnosis not present

## 2019-01-19 DIAGNOSIS — E109 Type 1 diabetes mellitus without complications: Secondary | ICD-10-CM | POA: Diagnosis not present

## 2019-01-19 DIAGNOSIS — N183 Chronic kidney disease, stage 3 (moderate): Secondary | ICD-10-CM | POA: Diagnosis not present

## 2019-01-26 DIAGNOSIS — G4733 Obstructive sleep apnea (adult) (pediatric): Secondary | ICD-10-CM | POA: Diagnosis not present

## 2019-01-26 DIAGNOSIS — I10 Essential (primary) hypertension: Secondary | ICD-10-CM | POA: Diagnosis not present

## 2020-06-27 IMAGING — US US EXTREM LOW VENOUS*R*
1 series · 13 of 24 positions shown · non-contrast
Comparison: None.

CLINICAL DATA: 57-year-old male with a history of cystic fibrosis
and right lower extremity edema for many years but worse over the
past month.



[Series 1: us extrem low venous*right* · 0.07mm/px · 13 of 36 slices shown]
[im 1/36]
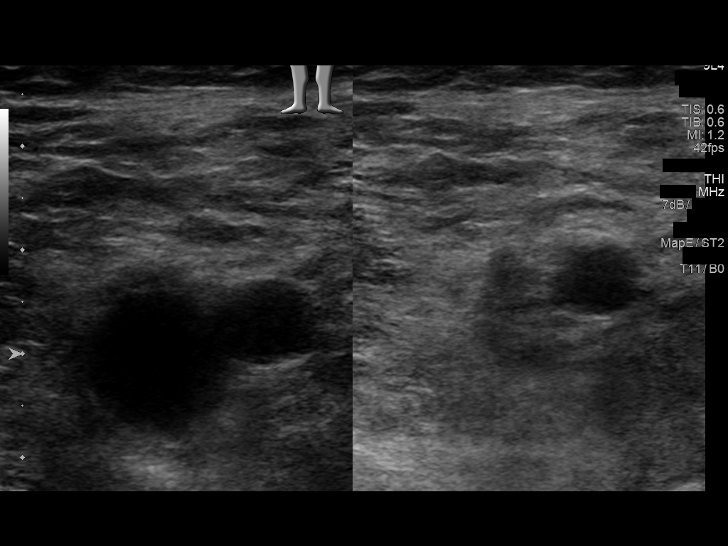
[im 4/36]
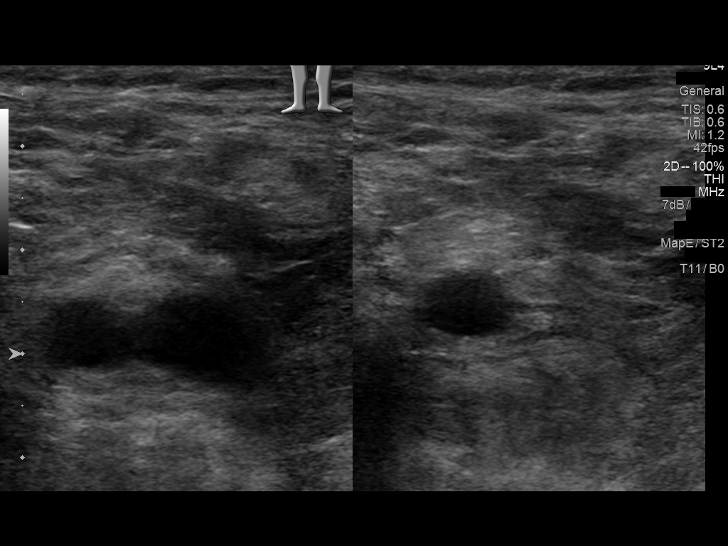
[im 7/36]
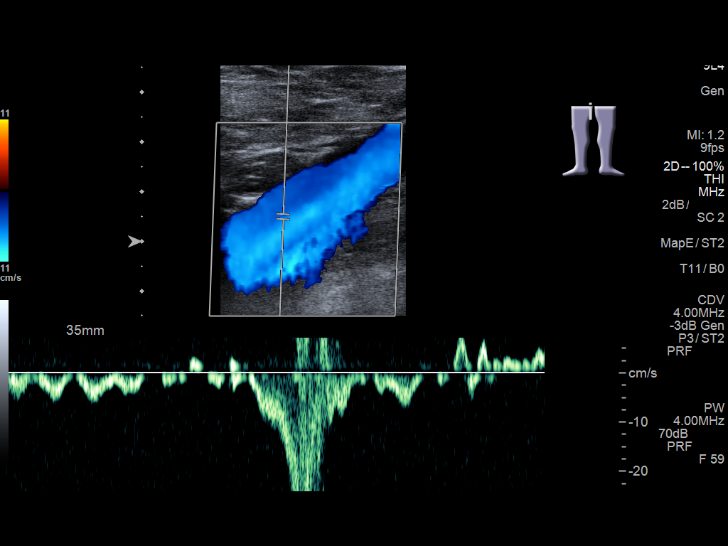
[im 10/36]
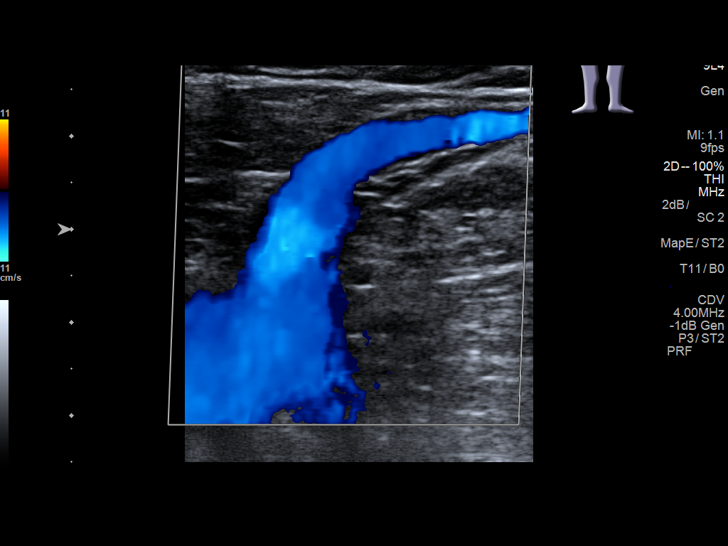
[im 13/36]
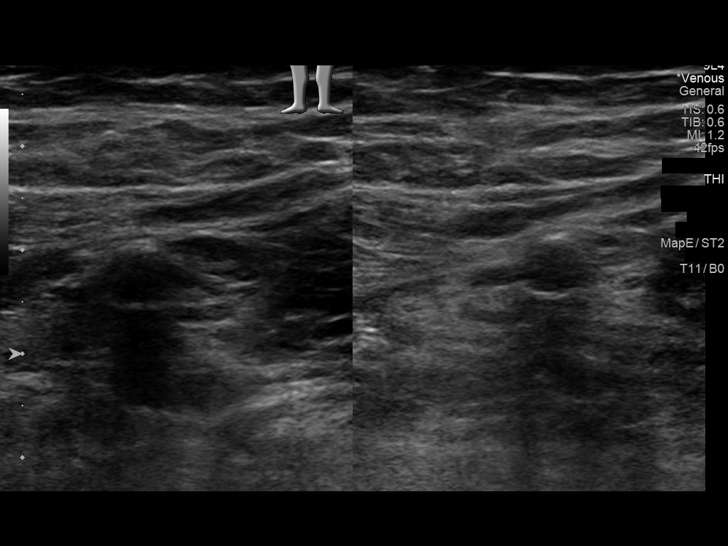
[im 16/36]
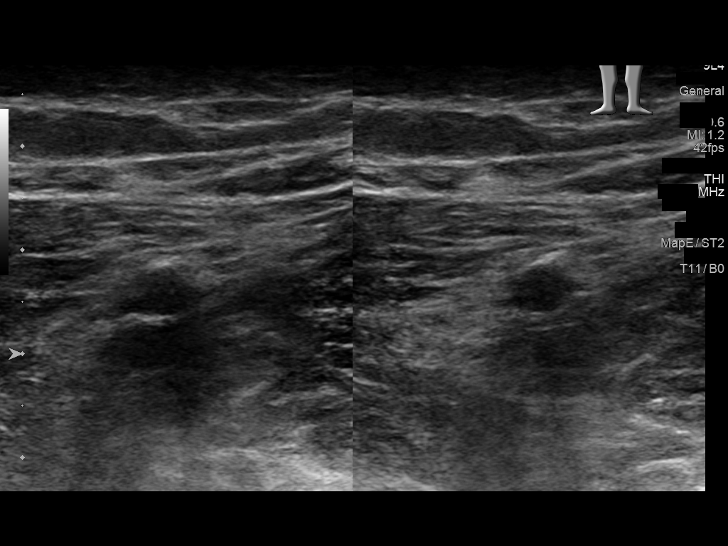
[im 19/36]
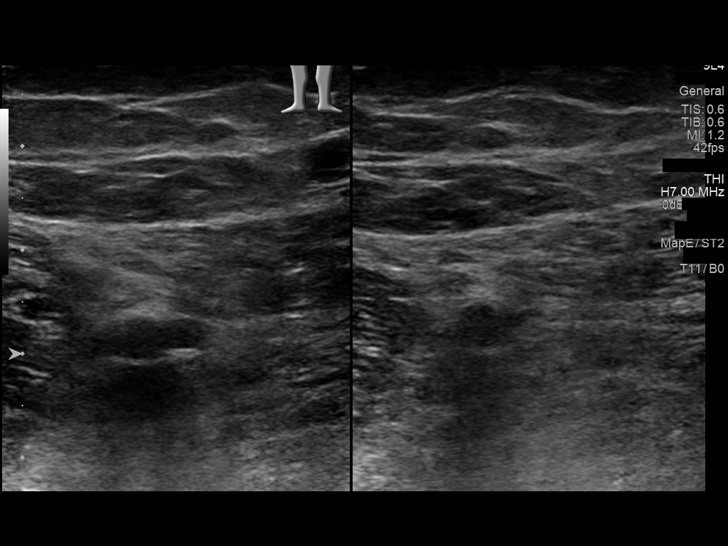
[im 20/36]
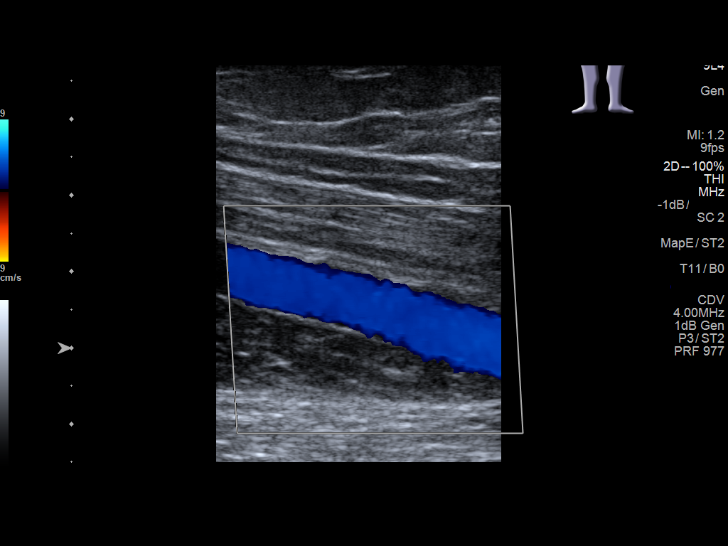
[im 23/36]
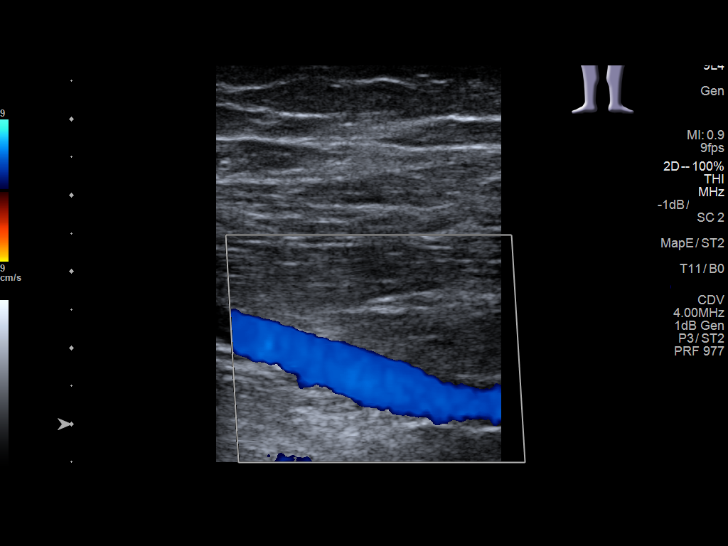
[im 26/36]
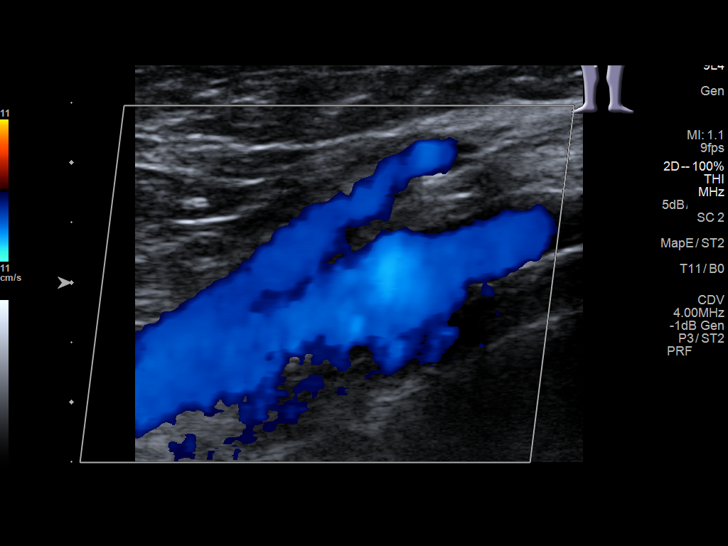
[im 29/36]
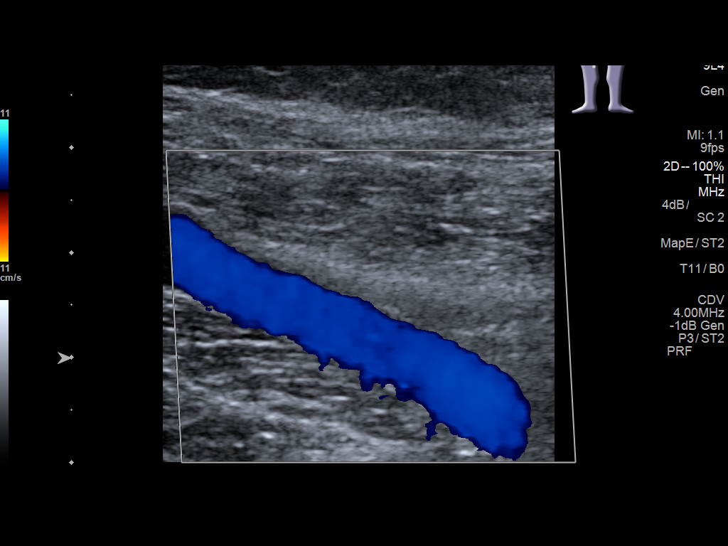
[im 32/36]
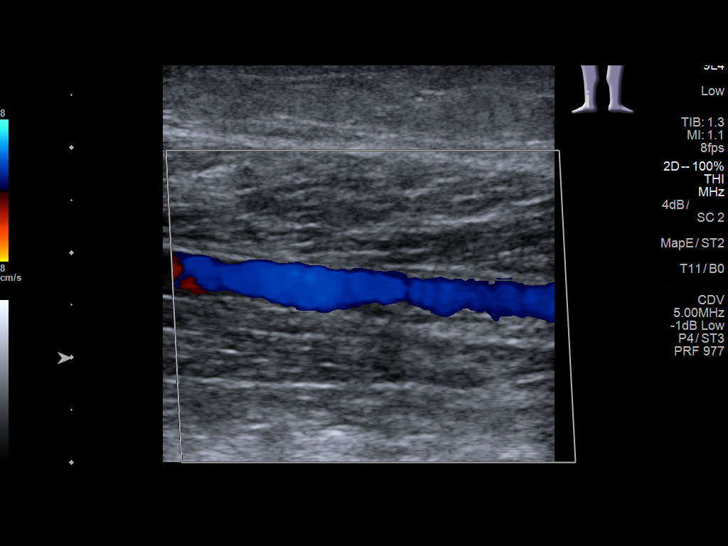
[im 36/36]
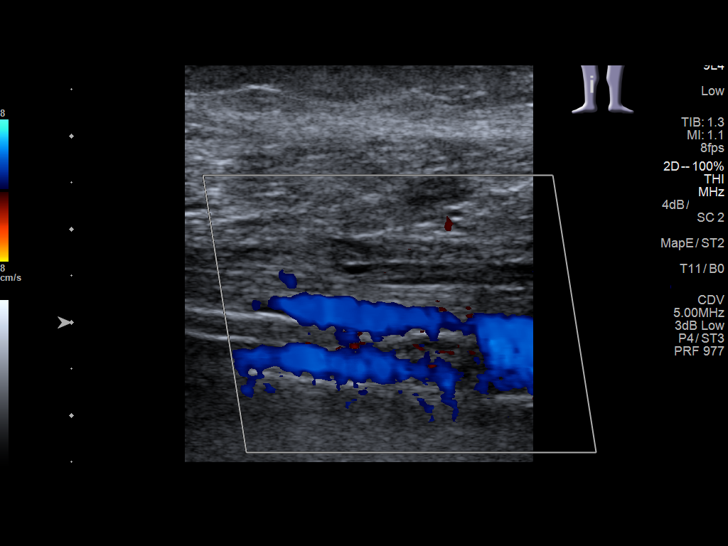

[13 of 24 positions shown; findings below may reference images not displayed]

FINDINGS: Contralateral Common Femoral Vein: Respiratory phasicity is normal
and symmetric with the symptomatic side. No evidence of thrombus.
Normal compressibility.

Common Femoral Vein: No evidence of thrombus. Normal
compressibility, respiratory phasicity and response to augmentation.

Saphenofemoral Junction: No evidence of thrombus. Normal
compressibility and flow on color Doppler imaging.

Profunda Femoral Vein: No evidence of thrombus. Normal
compressibility and flow on color Doppler imaging.

Femoral Vein: No evidence of thrombus. Normal compressibility,
respiratory phasicity and response to augmentation.

Popliteal Vein: No evidence of thrombus. Normal compressibility,
respiratory phasicity and response to augmentation.

Calf Veins: No evidence of thrombus. Normal compressibility and flow
on color Doppler imaging.

Superficial Great Saphenous Vein: No evidence of thrombus. Normal
compressibility.

Venous Reflux:  None.

Other Findings:  None.
IMPRESSION: No evidence of deep venous thrombosis.

## 2022-12-04 ENCOUNTER — Ambulatory Visit: Payer: Self-pay | Admitting: Family Medicine

## 2022-12-04 NOTE — Progress Notes (Deleted)
New patient visit   Patient: Terrence Brown   DOB: [birth year]   62 y.o. Male  MRN: [id number] Visit Date: 12/04/2022  Today's healthcare provider: Eulis Foster, MD   No chief complaint on file.  Subjective    Terrence Brown is a 62 y.o. male who presents today as a new patient to establish care.  HPI  ***  Past Medical History:  Diagnosis Date   Cystic fibrosis ([place])    Diabetes mellitus without complication ([place])    Osteopenia    Sleep apnea    TIA (transient ischemic attack)    Past Surgical History:  Procedure Laterality Date   COLONOSCOPY N/A 11/09/2015   Procedure: COLONOSCOPY;  Surgeon: Josefine Class, MD;  Location: [facility] ENDOSCOPY;  Service: Endoscopy;  Laterality: N/A;   Per office, patient HAS TO be 1st case - IDDM   knee arthoscopy Right    lung transplant Bilateral    No family status information on file.   No family history on file. Social History   Socioeconomic History   Marital status: Married    Spouse name: Not on file   Number of children: Not on file   Years of education: Not on file   Highest education level: Not on file  Occupational History   Not on file  Tobacco Use   Smoking status: Never   Smokeless tobacco: Never  Substance and Sexual Activity   Alcohol use: No   Drug use: No   Sexual activity: Yes  Other Topics Concern   Not on file  Social History Narrative   Not on file   Social Determinants of Health   Financial Resource Strain: Not on file  Food Insecurity: Not on file  Transportation Needs: Not on file  Physical Activity: Not on file  Stress: Not on file  Social Connections: Not on file   Outpatient Medications Prior to Visit  Medication Sig   alendronate (FOSAMAX) 70 MG tablet Take 70 mg by mouth every Tuesday. Take with a full glass of water on an empty stomach.    aspirin EC 81 MG tablet Take 81 mg by mouth daily.   atenolol (TENORMIN) 25 MG tablet Take 25 mg by mouth daily.    azaTHIOprine (IMURAN) 50 MG tablet Take 50 mg by mouth daily.   calcium carbonate (TUMS EX) 750 MG chewable tablet Chew 2 tablets by mouth daily.    docusate sodium (COLACE) 100 MG capsule Take 100 mg by mouth 2 (two) times daily.   ferrous sulfate 325 (65 FE) MG tablet Take 325 mg by mouth daily with breakfast.   insulin lispro (HUMALOG) 100 UNIT/ML injection Inject 0-15 Units into the skin as needed for high blood sugar. Sliding scale   insulin NPH Human (HUMULIN N,NOVOLIN N) 100 UNIT/ML injection Inject 15 Units into the skin 2 (two) times daily before a meal.    lipase/protease/amylase (CREON) 12000 UNITS CPEP capsule Take 1-7 Units by mouth 3 (three) times daily with meals. Based on meal content   magnesium oxide (MAG-OX) 400 MG tablet Take 400 mg by mouth 2 (two) times daily.    Multiple Vitamin (MULTIVITAMIN) capsule Take 1 capsule by mouth daily.   predniSONE (DELTASONE) 10 MG tablet Take 15 mg by mouth every other day.    tacrolimus (PROGRAF) 1 MG capsule Take 3 mg by mouth 2 (two) times daily.    traMADol (ULTRAM) 50 MG tablet Take 50 mg by mouth every 6 (six) hours as needed  for moderate pain.    tretinoin (RETIN-A) 0.01 % gel Apply 1 application topically as needed (acne).    zolpidem (AMBIEN) 10 MG tablet Take 10 mg by mouth at bedtime as needed for sleep.    No facility-administered medications prior to visit.   Allergies  Allergen Reactions   Percocet [Oxycodone-Acetaminophen]    Sulfa Antibiotics     Immunization History  Administered Date(s) Administered   Tdap 08/08/2016    Health Maintenance  Topic Date Due   Medicare Annual Wellness (AWV)  Never done   COVID-19 Vaccine (1) Never done   HIV Screening  Never done   Hepatitis C Screening  Never done   Zoster Vaccines- Shingrix (1 of 2) Never done   INFLUENZA VACCINE  07/01/2022   COLONOSCOPY (Pts 45-50yr Insurance coverage will need to be confirmed)  11/08/2025   DTaP/Tdap/Td (2 - Td or Tdap) 08/08/2026   HPV  VACCINES  Aged Out    Patient Care Team: KAdin Hector MD as PCP - General (Internal Medicine)  Review of Systems  {Labs  Heme  Chem  Endocrine  Serology  Results Review (optional):23779}   Objective    There were no vitals taken for this visit. {Show previous vital signs (optional):23777}  Physical Exam ***  Depression Screen    02/24/2017    6:14 AM  PHQ 2/9 Scores  PHQ - 2 Score 0  PHQ- 9 Score 6   No results found for any visits on 12/04/22.  Assessment & Plan      Problem List Items Addressed This Visit   None    No follow-ups on file.     I, MEulis Foster MD, have reviewed all documentation for this visit.  Portions of this information were initially documented by the CMA and reviewed by me for thoroughness and accuracy.      MEulis Foster MD  [phone](phone) [phone](fax)  [place]

## 2023-02-02 ENCOUNTER — Ambulatory Visit (INDEPENDENT_AMBULATORY_CARE_PROVIDER_SITE_OTHER): Payer: 59 | Admitting: Internal Medicine

## 2023-02-02 ENCOUNTER — Encounter: Payer: Self-pay | Admitting: Internal Medicine

## 2023-02-02 VITALS — BP 124/70 | HR 92 | Resp 16 | Ht 66.0 in | Wt 158.0 lb

## 2023-02-02 DIAGNOSIS — Z794 Long term (current) use of insulin: Secondary | ICD-10-CM

## 2023-02-02 DIAGNOSIS — Z23 Encounter for immunization: Secondary | ICD-10-CM

## 2023-02-02 DIAGNOSIS — Z942 Lung transplant status: Secondary | ICD-10-CM

## 2023-02-02 DIAGNOSIS — E785 Hyperlipidemia, unspecified: Secondary | ICD-10-CM

## 2023-02-02 DIAGNOSIS — M810 Age-related osteoporosis without current pathological fracture: Secondary | ICD-10-CM

## 2023-02-02 DIAGNOSIS — I158 Other secondary hypertension: Secondary | ICD-10-CM

## 2023-02-02 DIAGNOSIS — E1165 Type 2 diabetes mellitus with hyperglycemia: Secondary | ICD-10-CM

## 2023-02-02 MED ORDER — ATENOLOL 25 MG PO TABS: 25.0000 mg | ORAL_TABLET | Freq: Every day | ORAL | 0 refills | Status: DC

## 2023-02-02 MED ORDER — AZATHIOPRINE 50 MG PO TABS: 50.0000 mg | ORAL_TABLET | Freq: Every day | ORAL | 0 refills | Status: DC

## 2023-02-02 MED ORDER — CYCLOBENZAPRINE HCL 10 MG PO TABS: 10.0000 mg | ORAL_TABLET | Freq: Three times a day (TID) | ORAL | 1 refills | Status: AC | PRN

## 2023-02-02 MED ORDER — PREDNISONE 5 MG PO TABS: 15.0000 mg | ORAL_TABLET | ORAL | 1 refills | Status: DC

## 2023-02-02 MED ORDER — PREDNISONE 5 MG PO TABS: 15.0000 mg | ORAL_TABLET | ORAL | 2 refills | Status: DC

## 2023-02-02 MED ORDER — TACROLIMUS 1 MG PO CAPS: 3.0000 mg | ORAL_CAPSULE | Freq: Two times a day (BID) | ORAL | 2 refills | Status: DC

## 2023-02-02 MED ORDER — PANCRELIPASE (LIP-PROT-AMYL) 12000-38000 UNITS PO CPEP: 12000.0000 [IU] | ORAL_CAPSULE | Freq: Three times a day (TID) | ORAL | 0 refills | Status: DC

## 2023-02-02 MED ORDER — LANTUS SOLOSTAR 100 UNIT/ML ~~LOC~~ SOPN: PEN_INJECTOR | SUBCUTANEOUS | 99 refills | Status: DC

## 2023-02-02 MED ORDER — TACROLIMUS 1 MG PO CAPS: 3.0000 mg | ORAL_CAPSULE | Freq: Two times a day (BID) | ORAL | 3 refills | Status: DC

## 2023-02-02 MED ORDER — ALENDRONATE SODIUM 70 MG PO TABS: 70.0000 mg | ORAL_TABLET | ORAL | 0 refills | Status: DC

## 2023-02-02 MED ORDER — LOVASTATIN 40 MG PO TABS: 40.0000 mg | ORAL_TABLET | Freq: Every day | ORAL | 1 refills | Status: DC

## 2023-02-02 NOTE — Progress Notes (Signed)
New Patient Office Visit  Subjective    Patient ID: Terrence Brown, male    DOB: [birth year]  Age: 62 y.o. MRN: [id number]  CC:  Chief Complaint  Patient presents with   Establish Care    HPI Terrence Brown presents to establish care. He was being seen by a PCP at [facility].   Cystic Fibrosis b/l Lung Transplant -First diagnosed at 62 years old  -Underwent bilateral lung transplants in 2003 -Does not follow with pulmonology or transplant medicine  -Currently on Tacrolimus 3 mg BID, Imuran 50 mg, Prednisone 15 mg every other day  Hypertension/OSA: -Medications: Atenolol 25 mg -Patient is compliant with above medications and reports no side effects. -Denies any SOB, CP, vision changes, LE edema or symptoms of hypotension  Hx of TIA with deficits/HLD: -Medications: Lovastatin 40, aspirin  -Patient is compliant with above medications and reports no side effects.  -Last lipid panel: 8/23 total cholesterol 141, HDL 51, LDL 58, triglycerides 162  Diabetes, Type 2: -Last A1c 7.7 8/23 -Medications: Lantus 12 units in the am 10 at night, Humalin 22 regular and 22 NPH ? -Patient is compliant with the above medications and reports no side effects.  -Fasting home BG: 139 in the morning - is checking sugars at meals times and using a combination of regular and NPH insulin to get sugars down  Osteoporosis:  -Currently on Fosamax 70 mg weekly   Outpatient Encounter Medications as of 02/02/2023  Medication Sig   aspirin EC 81 MG tablet Take 81 mg by mouth daily.   calcium carbonate (TUMS EX) 750 MG chewable tablet Chew 2 tablets by mouth daily.    docusate sodium (COLACE) 100 MG capsule Take 100 mg by mouth 2 (two) times daily.   ferrous sulfate 325 (65 FE) MG tablet Take 325 mg by mouth daily with breakfast.   Glucose Blood (ONETOUCH ULTRA BLUE VI) by In Vitro route. 7x/daily PRN   insulin glargine (LANTUS SOLOSTAR) 100 UNIT/ML Solostar Pen Inject 12 units in the morning, 10 units  in the evening.   insulin lispro (HUMALOG) 100 UNIT/ML injection Inject 0-15 Units into the skin as needed for high blood sugar. Sliding scale   magnesium oxide (MAG-OX) 400 MG tablet Take 400 mg by mouth 2 (two) times daily.    Multiple Vitamin (MULTIVITAMIN) capsule Take 1 capsule by mouth daily.   tretinoin (RETIN-A) 0.01 % gel Apply 1 application topically as needed (acne).    [DISCONTINUED] alendronate (FOSAMAX) 70 MG tablet Take 70 mg by mouth every Tuesday. Take with a full glass of water on an empty stomach.    [DISCONTINUED] atenolol (TENORMIN) 25 MG tablet Take 25 mg by mouth daily.   [DISCONTINUED] azaTHIOprine (IMURAN) 50 MG tablet Take 50 mg by mouth daily.   [DISCONTINUED] cyclobenzaprine (FLEXERIL) 10 MG tablet Take 10 mg by mouth 3 (three) times daily as needed for muscle spasms.   [DISCONTINUED] insulin NPH Human (HUMULIN N,NOVOLIN N) 100 UNIT/ML injection Inject 15 Units into the skin 2 (two) times daily before a meal.    [DISCONTINUED] lipase/protease/amylase (CREON) 12000 UNITS CPEP capsule Take 1-7 Units by mouth 3 (three) times daily with meals. Based on meal content   [DISCONTINUED] lovastatin (MEVACOR) 40 MG tablet Take 40 mg by mouth at bedtime.   [DISCONTINUED] predniSONE (DELTASONE) 10 MG tablet Take 15 mg by mouth every other day.    [DISCONTINUED] predniSONE (DELTASONE) 5 MG tablet Take 3 tablets (15 mg total) by mouth every other day.   [  DISCONTINUED] tacrolimus (PROGRAF) 1 MG capsule Take 3 mg by mouth 2 (two) times daily.    alendronate (FOSAMAX) 70 MG tablet Take 1 tablet (70 mg total) by mouth once a week. Take with a full glass of water on an empty stomach.   atenolol (TENORMIN) 25 MG tablet Take 1 tablet (25 mg total) by mouth daily.   azaTHIOprine (IMURAN) 50 MG tablet Take 1 tablet (50 mg total) by mouth daily.   cyclobenzaprine (FLEXERIL) 10 MG tablet Take 1 tablet (10 mg total) by mouth 3 (three) times daily as needed for muscle spasms.    lipase/protease/amylase (CREON) 12000-38000 units CPEP capsule Take 1 capsule (12,000 Units total) by mouth 3 (three) times daily with meals. Based on meal content   lovastatin (MEVACOR) 40 MG tablet Take 1 tablet (40 mg total) by mouth at bedtime.   predniSONE (DELTASONE) 5 MG tablet Take 3 tablets (15 mg total) by mouth every other day.   tacrolimus (PROGRAF) 1 MG capsule Take 3 capsules (3 mg total) by mouth 2 (two) times daily.   [DISCONTINUED] tacrolimus (PROGRAF) 1 MG capsule Take 3 capsules (3 mg total) by mouth 2 (two) times daily.   [DISCONTINUED] traMADol (ULTRAM) 50 MG tablet Take 50 mg by mouth every 6 (six) hours as needed for moderate pain.  (Patient not taking: Reported on 02/02/2023)   [DISCONTINUED] zolpidem (AMBIEN) 10 MG tablet Take 10 mg by mouth at bedtime as needed for sleep.  (Patient not taking: Reported on 02/02/2023)   No facility-administered encounter medications on file as of 02/02/2023.    Past Medical History:  Diagnosis Date   Cystic fibrosis ([place])    Diabetes mellitus without complication ([place])    Osteopenia    Sleep apnea    TIA (transient ischemic attack)     Past Surgical History:  Procedure Laterality Date   COLONOSCOPY N/A 11/09/2015   Procedure: COLONOSCOPY;  Surgeon: Josefine Class, MD;  Location: [facility] ENDOSCOPY;  Service: Endoscopy;  Laterality: N/A;   Per office, patient HAS TO be 1st case - IDDM   knee arthoscopy Right    lung transplant Bilateral     No family history on file.  Social History   Socioeconomic History   Marital status: Married    Spouse name: Not on file   Number of children: Not on file   Years of education: Not on file   Highest education level: Not on file  Occupational History   Not on file  Tobacco Use   Smoking status: Never   Smokeless tobacco: Never  Substance and Sexual Activity   Alcohol use: No   Drug use: No   Sexual activity: Yes  Other Topics Concern   Not on file  Social History Narrative   Not  on file   Social Determinants of Health   Financial Resource Strain: Not on file  Food Insecurity: Not on file  Transportation Needs: Not on file  Physical Activity: Not on file  Stress: Not on file  Social Connections: Not on file  Intimate Partner Violence: Not on file    Review of Systems  All other systems reviewed and are negative.       Objective    BP 124/70   Pulse 92   Resp 16   Ht '5\' 6"'$  (1.676 m)   Wt 158 lb (71.7 kg)   SpO2 97%   BMI 25.50 kg/m   Physical Exam Constitutional:      Appearance: Normal appearance.  HENT:  Head: Normocephalic and atraumatic.  Eyes:     Conjunctiva/sclera: Conjunctivae normal.  Cardiovascular:     Rate and Rhythm: Normal rate and regular rhythm.  Pulmonary:     Effort: Pulmonary effort is normal.     Breath sounds: Normal breath sounds.  Skin:    General: Skin is warm and dry.  Neurological:     General: No focal deficit present.     Mental Status: He is alert. Mental status is at baseline.  Psychiatric:        Mood and Affect: Mood normal.        Behavior: Behavior normal.         Assessment & Plan:   1. Cystic fibrosis (HCC)/S/P lung transplant [facility]): Not being managed by specialists. Currently on Tacrolimus 3 mg BID, Imuran 50 mg, Prednisone 15 mg every other day. Will check Tacrolimus level today and I will refill medications for 90 days but a referral to transplant medicine will be placed for long term management.   - Tacrolimus,Highly Sensitive,LC/MS/MS - azaTHIOprine (IMURAN) 50 MG tablet; Take 1 tablet (50 mg total) by mouth daily.  Dispense: 90 tablet; Refill: 0 - tacrolimus (PROGRAF) 1 MG capsule; Take 3 capsules (3 mg total) by mouth 2 (two) times daily.  Dispense: 180 capsule; Refill: 2 - predniSONE (DELTASONE) 5 MG tablet; Take 3 tablets (15 mg total) by mouth every other day.  Dispense: 45 tablet; Refill: 2 - Basic Metabolic Panel (BMET) - CBC w/Diff/Platelet - cyclobenzaprine (FLEXERIL) 10 MG  tablet; Take 1 tablet (10 mg total) by mouth 3 (three) times daily as needed for muscle spasms.  Dispense: 90 tablet; Refill: 1 - lipase/protease/amylase (CREON) 12000-38000 units CPEP capsule; Take 1 capsule (12,000 Units total) by mouth 3 (three) times daily with meals. Based on meal content  Dispense: 270 capsule; Refill: 0  2. Other secondary hypertension: Stable, continue Atenolol 25 mg, refilled.  - atenolol (TENORMIN) 25 MG tablet; Take 1 tablet (25 mg total) by mouth daily.  Dispense: 90 tablet; Refill: 0  3. Hyperlipidemia, unspecified hyperlipidemia type: Reviewed cholesterol panel from August, continue Lovastatin 40 mg, aspirin.   - lovastatin (MEVACOR) 40 MG tablet; Take 1 tablet (40 mg total) by mouth at bedtime.  Dispense: 90 tablet; Refill: 1  4. Type 2 diabetes mellitus with hyperglycemia, with long-term current use of insulin ([place]): Check an A1c today. He is currently on Lantus 12 units in the am, 10 units in the evening and a combination of NPH and regular insulin as needed - uncertain. I am not going to prescribe the as needed insulins due to risk of hypoglycemia. Lantus will be refilled, may require Endocrinology referral.   - HgB A1c - insulin glargine (LANTUS SOLOSTAR) 100 UNIT/ML Solostar Pen; Inject 12 units in the morning, 10 units in the evening.  Dispense: 15 mL; Refill: PRN  5. Osteoporosis without current pathological fracture, unspecified osteoporosis type: Fosamax refilled.  - alendronate (FOSAMAX) 70 MG tablet; Take 1 tablet (70 mg total) by mouth once a week. Take with a full glass of water on an empty stomach.  Dispense: 12 tablet; Refill: 0  6. Need for influenza vaccination: Flu vaccine administered.   - Flu Vaccine QUAD 26moIM (Fluarix, Fluzone & Alfiuria Quad PF)   Return in about 4 weeks (around 03/02/2023).   ETeodora Medici DO

## 2023-02-03 ENCOUNTER — Telehealth: Payer: Self-pay

## 2023-02-03 NOTE — Telephone Encounter (Signed)
Per Dr. Rosana Berger:  "Please let the patient know that after further review of his medications and records, I'm going to place a referral to transplant medicine for further follow up on his transplant medications. I will refill them for 90 days until he can be seen there but this needs to be managed by a specialist. Secondly, I am uncomfortable with how his insulin has been prescribed. I will continue the Lantus 12 units in the morning and 10 units in the evening but I will not prescribe this in combination with the NPH and regular insulin. The risk of hypoglycemia is too high. I will be happy to prescribe short acting meal time insulin or to discuss other management but I will not continue his current insulin. I sent his Lantus already to the pharmacy. I can place a referral to Endocrinology or prescribe insulin how I am comfortable. If this is an issue for him then I cannot accept him as a patient here."    02/03/23 Called and left voicemail for callback.

## 2023-02-04 LAB — CBC WITH DIFFERENTIAL/PLATELET
Absolute Monocytes: 626 cells/uL (ref 200–950)
Basophils Absolute: 50 cells/uL (ref 0–200)
Basophils Relative: 0.7 %
Eosinophils Absolute: 238 cells/uL (ref 15–500)
Eosinophils Relative: 3.3 %
HCT: 39.5 % (ref 38.5–50.0)
Hemoglobin: 13.7 g/dL (ref 13.2–17.1)
Lymphs Abs: 1980 cells/uL (ref 850–3900)
MCH: 31.4 pg (ref 27.0–33.0)
MCHC: 34.7 g/dL (ref 32.0–36.0)
MCV: 90.6 fL (ref 80.0–100.0)
MPV: 11.4 fL (ref 7.5–12.5)
Monocytes Relative: 8.7 %
Neutro Abs: 4306 cells/uL (ref 1500–7800)
Neutrophils Relative %: 59.8 %
Platelets: 245 10*3/uL (ref 140–400)
RBC: 4.36 10*6/uL (ref 4.20–5.80)
RDW: 11.2 % (ref 11.0–15.0)
Total Lymphocyte: 27.5 %
WBC: 7.2 10*3/uL (ref 3.8–10.8)

## 2023-02-04 LAB — BASIC METABOLIC PANEL
BUN/Creatinine Ratio: 9 (calc) (ref 6–22)
BUN: 13 mg/dL (ref 7–25)
CO2: 23 mmol/L (ref 20–32)
Calcium: 9.3 mg/dL (ref 8.6–10.3)
Chloride: 99 mmol/L (ref 98–110)
Creat: 1.39 mg/dL — ABNORMAL HIGH (ref 0.70–1.35)
Glucose, Bld: 168 mg/dL — ABNORMAL HIGH (ref 65–99)
Potassium: 4 mmol/L (ref 3.5–5.3)
Sodium: 133 mmol/L — ABNORMAL LOW (ref 135–146)

## 2023-02-04 LAB — HEMOGLOBIN A1C
Hgb A1c MFr Bld: 7.3 % of total Hgb — ABNORMAL HIGH (ref <5.7)
Mean Plasma Glucose: 163 mg/dL
eAG (mmol/L): 9 mmol/L

## 2023-02-04 LAB — TACROLIMUS,HIGHLY SENSITIVE,LC/MS/MS: Tacrolimus Lvl: 8.4 mcg/L

## 2023-02-06 ENCOUNTER — Telehealth: Payer: Self-pay

## 2023-02-06 NOTE — Telephone Encounter (Signed)
Try to callback Monday regarding labs

## 2023-02-09 NOTE — Telephone Encounter (Signed)
Left vm

## 2023-02-10 NOTE — Telephone Encounter (Signed)
Called patient to review prior telephone message that DR. Rosana Berger wrote regarding will not refill anti rejection meds for lung transplant and he must f/u/ be referred to pulmonology.  Patient refuses and states will find new provider.

## 2023-02-10 NOTE — Telephone Encounter (Signed)
Left detailed vm °

## 2023-02-26 ENCOUNTER — Encounter: Payer: Self-pay | Admitting: Nurse Practitioner

## 2023-02-26 ENCOUNTER — Ambulatory Visit (INDEPENDENT_AMBULATORY_CARE_PROVIDER_SITE_OTHER): Payer: 59 | Admitting: Nurse Practitioner

## 2023-02-26 VITALS — BP 130/68 | HR 72 | Temp 98.2°F | Ht 66.0 in | Wt 160.0 lb

## 2023-02-26 DIAGNOSIS — Z794 Long term (current) use of insulin: Secondary | ICD-10-CM

## 2023-02-26 DIAGNOSIS — E1165 Type 2 diabetes mellitus with hyperglycemia: Secondary | ICD-10-CM | POA: Diagnosis not present

## 2023-02-26 DIAGNOSIS — Z942 Lung transplant status: Secondary | ICD-10-CM | POA: Diagnosis not present

## 2023-02-26 DIAGNOSIS — E785 Hyperlipidemia, unspecified: Secondary | ICD-10-CM

## 2023-02-26 DIAGNOSIS — I158 Other secondary hypertension: Secondary | ICD-10-CM

## 2023-02-26 MED ORDER — PANCREAZE 4200-14200 UNITS PO CPEP: ORAL_CAPSULE | ORAL | 3 refills | Status: DC

## 2023-02-26 MED ORDER — INSULIN PEN NEEDLE 31G X 8 MM MISC: 3 refills | Status: DC

## 2023-02-26 NOTE — Progress Notes (Signed)
Tomasita Morrow, NP-C Phone: [phone]  Terrence Brown is a 62 y.o. male who presents today to establish care. He has no complaints or new concerns today. He is requesting refills on his insulin syringes.   He has a significant past medical history involving Cystic Fibrosis, diagnosed at age 71, and a bilateral lung transplant in 2003. He does not follow with Pulmonology or Transplant Medicine. He is currently on Tacrolimus 3 mg twice daily, Imuran 50 mg daily and Prednisone 15 mg every other day.   HYPERTENSION Disease Monitoring: Blood pressure range- Not checking Chest pain- No      Dyspnea- No Medications: Compliance- Atenolol Lightheadedness- No   Edema- No  Lab Results  Component Value Date   NA 133 (L) 02/02/2023   K 4.0 02/02/2023   CO2 23 02/02/2023   GLUCOSE 168 (H) 02/02/2023   BUN 13 02/02/2023   CREATININE 1.39 (H) 02/02/2023   CALCIUM 9.3 02/02/2023   GFRNONAA 50 (L) 08/19/2018    DIABETES Disease Monitoring: Blood Sugar ranges- 140s, check 6-7 times per day  Polyuria/phagia/dipsia- No      Optho- Yes- UTD, no retinopathy Medications: Compliance- Lantus, Humalin regular and NPH- has been on this regimen for 20+ years. Hypoglycemic symptoms- Rarely, less than once per week Lab Results  Component Value Date   HGBA1C 7.3 (H) 02/02/2023     HYPERLIPIDEMIA Disease Monitoring: See symptoms for Hypertension Medications: Compliance- Lovastatin Right upper quadrant pain- No  Muscle aches- No   Active Ambulatory Problems    Diagnosis Date Noted   S/P lung transplant 02/02/2023   Cystic fibrosis 02/02/2023   Other secondary hypertension 02/02/2023   Hyperlipidemia 02/02/2023   Type 2 diabetes mellitus with hyperglycemia, with long-term current use of insulin 02/02/2023   Osteoporosis without current pathological fracture 02/02/2023   Resolved Ambulatory Problems    Diagnosis Date Noted   No Resolved Ambulatory Problems   Past Medical History:   Diagnosis Date   Diabetes mellitus without complication ([place])    Osteopenia    Sleep apnea    TIA (transient ischemic attack)     No family history on file.  Social History   Socioeconomic History   Marital status: Married    Spouse name: Not on file   Number of children: Not on file   Years of education: Not on file   Highest education level: Not on file  Occupational History   Not on file  Tobacco Use   Smoking status: Never   Smokeless tobacco: Never  Substance and Sexual Activity   Alcohol use: No   Drug use: No   Sexual activity: Yes  Other Topics Concern   Not on file  Social History Narrative   Not on file   Social Determinants of Health   Financial Resource Strain: Not on file  Food Insecurity: Not on file  Transportation Needs: Not on file  Physical Activity: Not on file  Stress: Not on file  Social Connections: Not on file  Intimate Partner Violence: Not on file    ROS  General:  Negative for unexplained weight loss, fever Skin: Negative for new or changing mole, sore that won't heal HEENT: Negative for trouble hearing, trouble seeing, ringing in ears, mouth sores, hoarseness, change in voice, dysphagia. CV:  Negative for chest pain, dyspnea, edema, palpitations Resp: Negative for cough, dyspnea, hemoptysis GI: Negative for nausea, vomiting, diarrhea, constipation, abdominal pain, melena, hematochezia. GU: Negative for dysuria, incontinence, urinary hesitance, hematuria, vaginal or penile discharge, polyuria,  sexual difficulty, lumps in testicle or breasts MSK: Negative for muscle cramps or aches, joint pain or swelling Neuro: Negative for headaches, weakness, numbness, dizziness, passing out/fainting Psych: Negative for depression, anxiety, memory problems  Objective  Physical Exam Vitals:   02/26/23 1529  BP: 130/68  Pulse: 72  Temp: 98.2 F (36.8 C)  SpO2: 97%    BP Readings from Last 3 Encounters:  02/26/23 130/68  02/02/23 124/70   08/20/18 (!) 170/73   Wt Readings from Last 3 Encounters:  02/26/23 160 lb (72.6 kg)  02/02/23 158 lb (71.7 kg)  08/19/18 163 lb (73.9 kg)    Physical Exam Constitutional:      General: He is not in acute distress.    Appearance: Normal appearance.  HENT:     Head: Normocephalic.  Cardiovascular:     Rate and Rhythm: Normal rate and regular rhythm.     Heart sounds: Normal heart sounds.  Pulmonary:     Effort: Pulmonary effort is normal.     Breath sounds: Normal breath sounds.  Skin:    General: Skin is warm and dry.  Neurological:     General: No focal deficit present.     Mental Status: He is alert.  Psychiatric:        Mood and Affect: Mood normal.        Behavior: Behavior normal.    Assessment/Plan:   Type 2 diabetes mellitus with hyperglycemia, with long-term current use of insulin (HCC) Assessment & Plan: Chronic. Stable on current regimen. Last A1c- 7.3. Patient on Lantus, Humalin Regular and NPH insulin. He is not open to seeing Endocrinology. Will refer to Pharmacy for help with insulin management. Refills on insulin syringes sent. Will continue to monitor.   Orders: -     Insulin Pen Needle; Use as directed to inject insulin subcutaneously up to 6 times daily.  Dispense: 600 each; Refill: 3  S/P lung transplant [facility]) Assessment & Plan: Not currently followed by Pulmonology. Discussed with patient need for long term management as I am not comfortable managing his medications. Patient does not need refills at this time. Continue Tacrolimus 3 mg BID, Imuran 50 mg daily and Prednisone 15 mg every other day. Referral placed.    Cystic fibrosis ([place]) Assessment & Plan: Does not see Pulmonology. Will refer. Refills on Pancreaze sent to mail order pharmacy. Continue current medication regimen.   Orders: -     Pancreaze; Take 10-15 capsules by mouth with meals and 0-5 capsules by mouth with snacks.  Dispense: 4500 capsule; Refill: 3  Hyperlipidemia, unspecified  hyperlipidemia type Assessment & Plan: Chronic. Stable on Lovastatin 40 mg daily. Continue. Will recheck lipids at next appointment. Encouraged healthy diet.    Other secondary hypertension Assessment & Plan: Chronic. Stable on Atenolol 25 mg daily. Continue.     Return in about 3 months (around 05/29/2023) for Follow up.   Tomasita Morrow, NP-C [place]

## 2023-03-03 ENCOUNTER — Telehealth: Payer: Self-pay | Admitting: Nurse Practitioner

## 2023-03-03 ENCOUNTER — Encounter: Payer: Self-pay | Admitting: Nurse Practitioner

## 2023-03-03 NOTE — Assessment & Plan Note (Addendum)
Chronic. Stable on Lovastatin 40 mg daily. Continue. Will recheck lipids at next appointment. Encouraged healthy diet.

## 2023-03-03 NOTE — Assessment & Plan Note (Addendum)
Not currently followed by Pulmonology. Discussed with patient need for long term management as I am not comfortable managing his medications. Patient does not need refills at this time. Continue Tacrolimus 3 mg BID, Imuran 50 mg daily and Prednisone 15 mg every other day. Referral placed.

## 2023-03-03 NOTE — Telephone Encounter (Signed)
Patient called and wanted Kacy to know he called in insulin pens it should be the insulin short gauge needles (fine). Patient would like the correct insulin called in.

## 2023-03-03 NOTE — Assessment & Plan Note (Addendum)
Chronic. Stable on current regimen. Last A1c- 7.3. Patient on Lantus, Humalin Regular and NPH insulin. He is not open to seeing Endocrinology. Will refer to Pharmacy for help with insulin management. Refills on insulin syringes sent. Will continue to monitor.

## 2023-03-03 NOTE — Assessment & Plan Note (Addendum)
Does not see Pulmonology. Will refer. Refills on Pancreaze sent to mail order pharmacy. Continue current medication regimen.

## 2023-03-03 NOTE — Assessment & Plan Note (Signed)
Chronic. Stable on Atenolol 25 mg daily. Continue.

## 2023-03-04 NOTE — Telephone Encounter (Signed)
Called and spoke with pt and he would like for me to cancel his July 2nd appt and he would no longer like to be seen here., pt stated he will go back to his other team at [facility].   Pt is disappointed that no one wants to take over his transplant medications, pt stated it sounds like in order to make [place] happy I would need to see my transplant team because a pulmonologist is not going to know what to do with these meds. He stated he has not seen his transplant team in almost 20 years.   He also stated that no one asked him about his transplant teams info or contact info and that they would have been happy to talk to her about his meds. I informed the pt that he did in fact discuss with Ollen Gross about [facility] pulmonology team and that she told him she does not feel comfortable in handling these medications.

## 2023-03-04 NOTE — Telephone Encounter (Signed)
Phone went straight to VM left a msg to call back

## 2023-03-05 ENCOUNTER — Ambulatory Visit: Payer: Medicare Other | Admitting: Internal Medicine

## 2023-03-16 ENCOUNTER — Encounter: Payer: Self-pay | Admitting: Nurse Practitioner

## 2023-03-17 ENCOUNTER — Telehealth: Payer: Self-pay

## 2023-03-17 NOTE — Progress Notes (Signed)
  I reached out to patient to schedule referral with Pharm d. Patient stated that he was unaware of referral and I asked him if he had any med concerns or cost concerns. Patient got a little agitated and said well you all have messed up the amount of meds I am supposed to get several times. I thought that the pt thought I was with the Pharmacy because that happens often, I said sir I am not with the pharmacy I am calling with your Dr. Isidore Moos.The Pharmacist works with them. He said I know that do you think I am an idiot I said sir I did not say that in any way. Patient said it sounds like that you did. I said I apologize he said are you going to be quiet and let me talk. I said sir please do not speak to me that way he said well then hang up. He then hung up on me.  Pt declined scheduling with Pharm d    Thank you, Penne Lash, RMA Care Guide [facility] [place], [place] [address] Direct Dial: [phone] [email or web address]

## 2023-03-23 ENCOUNTER — Telehealth: Payer: Self-pay | Admitting: Nurse Practitioner

## 2023-03-23 NOTE — Telephone Encounter (Signed)
Patient dismissed form [facility] and all [facility] practices / providers due to patient / provider relationship being comprised. 03/16/23

## 2023-03-24 ENCOUNTER — Ambulatory Visit (INDEPENDENT_AMBULATORY_CARE_PROVIDER_SITE_OTHER): Payer: 59 | Admitting: Nurse Practitioner

## 2023-03-24 ENCOUNTER — Encounter: Payer: Self-pay | Admitting: Nurse Practitioner

## 2023-03-24 VITALS — BP 116/78 | HR 68 | Ht 66.0 in | Wt 163.0 lb

## 2023-03-24 DIAGNOSIS — Z794 Long term (current) use of insulin: Secondary | ICD-10-CM

## 2023-03-24 DIAGNOSIS — E1165 Type 2 diabetes mellitus with hyperglycemia: Secondary | ICD-10-CM

## 2023-03-24 DIAGNOSIS — Z942 Lung transplant status: Secondary | ICD-10-CM | POA: Diagnosis not present

## 2023-03-24 DIAGNOSIS — E785 Hyperlipidemia, unspecified: Secondary | ICD-10-CM

## 2023-03-24 DIAGNOSIS — I158 Other secondary hypertension: Secondary | ICD-10-CM

## 2023-03-24 DIAGNOSIS — E782 Mixed hyperlipidemia: Secondary | ICD-10-CM | POA: Diagnosis not present

## 2023-03-24 MED ORDER — ATENOLOL 25 MG PO TABS: 25.0000 mg | ORAL_TABLET | Freq: Every day | ORAL | 3 refills | Status: DC

## 2023-03-24 MED ORDER — LOVASTATIN 40 MG PO TABS: 40.0000 mg | ORAL_TABLET | Freq: Every day | ORAL | 3 refills | Status: DC

## 2023-03-24 MED ORDER — AZATHIOPRINE 50 MG PO TABS: 50.0000 mg | ORAL_TABLET | Freq: Every day | ORAL | 3 refills | Status: DC

## 2023-03-24 MED ORDER — TACROLIMUS 1 MG PO CAPS
3.0000 mg | ORAL_CAPSULE | Freq: Two times a day (BID) | ORAL | 2 refills | Status: DC
Start: 2023-03-24 — End: 2023-06-12

## 2023-03-24 MED ORDER — PANCREAZE 4200-14200 UNITS PO CPEP
ORAL_CAPSULE | ORAL | 3 refills | Status: AC
Start: 2023-03-24 — End: ?

## 2023-03-24 MED ORDER — PREDNISONE 5 MG PO TABS
15.0000 mg | ORAL_TABLET | ORAL | 3 refills | Status: DC
Start: 2023-03-24 — End: 2023-03-31

## 2023-03-24 MED ORDER — INSULIN PEN NEEDLE 31G X 8 MM MISC: 3 refills | Status: DC

## 2023-03-24 NOTE — Progress Notes (Signed)
New Patient Office Visit  Subjective    Patient ID: Terrence Brown, male    DOB: May 17, 1961  Age: 62 y.o. MRN: [id number]  CC:  Chief Complaint  Patient presents with   Establish Care    NPE    HPI Terrence Brown presents to establish care Patient here today as a new patient.  Was diagnosed with CF at age 63 yrs old.  One physician stated he was going to be deceased by age 28 yrs of age.  Occupation was special ed Runner, broadcasting/film/video.  Moved to [facility] for lung transplant.  Dec 03, 2001.  Left posterior tendon pain x 3 days.  Outpatient Encounter Medications as of 03/24/2023  Medication Sig   alendronate (FOSAMAX) 70 MG tablet Take 1 tablet (70 mg total) by mouth once a week. Take with a full glass of water on an empty stomach.   aspirin EC 81 MG tablet Take 81 mg by mouth daily.   atenolol (TENORMIN) 25 MG tablet Take 1 tablet (25 mg total) by mouth daily.   azaTHIOprine (IMURAN) 50 MG tablet Take 1 tablet (50 mg total) by mouth daily.   calcium carbonate (TUMS EX) 750 MG chewable tablet Chew 2 tablets by mouth daily.    cyclobenzaprine (FLEXERIL) 10 MG tablet Take 1 tablet (10 mg total) by mouth 3 (three) times daily as needed for muscle spasms.   docusate sodium (COLACE) 100 MG capsule Take 100 mg by mouth 2 (two) times daily.   ferrous sulfate 325 (65 FE) MG tablet Take 325 mg by mouth daily with breakfast.   Glucose Blood (ONETOUCH ULTRA BLUE VI) by In Vitro route. 7x/daily PRN   HUMULIN N 100 UNIT/ML injection Inject 40 Units into the skin 2 (two) times daily before a meal.   insulin glargine (LANTUS SOLOSTAR) 100 UNIT/ML Solostar Pen Inject 12 units in the morning, 10 units in the evening. (Patient taking differently: Inject into the skin. Inject 12 units in the morning, 10 units in the evening. (Pt taking 20 units daily))   Insulin Pen Needle 31G X 8 MM MISC Use as directed to inject insulin subcutaneously up to 6 times daily.   lovastatin (MEVACOR) 40 MG tablet Take 1 tablet (40 mg  total) by mouth at bedtime.   magnesium oxide (MAG-OX) 400 MG tablet Take 400 mg by mouth 2 (two) times daily.    Multiple Vitamin (MULTIVITAMIN) capsule Take 1 capsule by mouth daily.   predniSONE (DELTASONE) 5 MG tablet Take 3 tablets (15 mg total) by mouth every other day.   tacrolimus (PROGRAF) 1 MG capsule Take 3 capsules (3 mg total) by mouth 2 (two) times daily. (Patient taking differently: Take 3 mg by mouth 2 (two) times daily. (Pt stated only send this to [facility] pharmacy))   tretinoin (RETIN-A) 0.01 % gel Apply 1 application topically as needed (acne).    [DISCONTINUED] Pancrelipase, Lip-Prot-Amyl, (PANCREAZE) 4200-14200 units CPEP Take 10-15 capsules by mouth with meals and 0-5 capsules by mouth with snacks.   insulin lispro (HUMALOG) 100 UNIT/ML injection Inject 0-15 Units into the skin as needed for high blood sugar. Sliding scale (Patient not taking: Reported on 03/24/2023)   Pancrelipase, Lip-Prot-Amyl, (PANCREAZE) 4200-14200 units CPEP Take 10-15 capsules by mouth with meals and 0-5 capsules by mouth with snacks.   No facility-administered encounter medications on file as of 03/24/2023.    Past Medical History:  Diagnosis Date   Cystic fibrosis    Diabetes mellitus without complication    Osteopenia  Sleep apnea    TIA (transient ischemic attack)     Past Surgical History:  Procedure Laterality Date   COLONOSCOPY N/A 11/09/2015   Procedure: COLONOSCOPY;  Surgeon: Elnita Maxwell, MD;  Location: [facility] ENDOSCOPY;  Service: Endoscopy;  Laterality: N/A;   Per office, patient HAS TO be 1st case - IDDM   knee arthoscopy Right    lung transplant Bilateral     No family history on file.  Social History   Socioeconomic History   Marital status: Married    Spouse name: Not on file   Number of children: Not on file   Years of education: Not on file   Highest education level: Not on file  Occupational History   Not on file  Tobacco Use   Smoking status: Never    Smokeless tobacco: Never  Substance and Sexual Activity   Alcohol use: No   Drug use: No   Sexual activity: Yes  Other Topics Concern   Not on file  Social History Narrative   Not on file   Social Determinants of Health   Financial Resource Strain: Not on file  Food Insecurity: Not on file  Transportation Needs: Not on file  Physical Activity: Not on file  Stress: Not on file  Social Connections: Not on file  Intimate Partner Violence: Not on file    Review of Systems  Constitutional:  Positive for malaise/fatigue.  HENT:  Positive for hearing loss.   Eyes: Negative.   Respiratory:  Positive for cough.   Cardiovascular: Negative.   Gastrointestinal:  Positive for diarrhea.  Genitourinary:  Positive for urgency.  Musculoskeletal: Negative.   Skin: Negative.   Neurological: Negative.   Endo/Heme/Allergies:  Bruises/bleeds easily.  Psychiatric/Behavioral:  The patient has insomnia.         Objective    BP 116/78   Pulse 68   Ht  (1.676 m)   Wt 163 lb (73.9 kg)   SpO2 98%   BMI 26.31 kg/m   Physical Exam Vitals reviewed.  Constitutional:      Appearance: Normal appearance.  HENT:     Head: Normocephalic.     Nose: Nose normal.     Mouth/Throat:     Mouth: Mucous membranes are moist.  Eyes:     Pupils: Pupils are equal, round, and reactive to light.  Cardiovascular:     Rate and Rhythm: Normal rate and regular rhythm.  Pulmonary:     Effort: Pulmonary effort is normal.     Breath sounds: Normal breath sounds.  Abdominal:     General: Bowel sounds are normal.     Palpations: Abdomen is soft.  Musculoskeletal:        General: Normal range of motion.     Cervical back: Normal range of motion and neck supple.  Skin:    General: Skin is warm and dry.  Neurological:     Mental Status: He is alert and oriented to person, place, and time.  Psychiatric:        Mood and Affect: Mood normal.        Behavior: Behavior normal.         Assessment  & Plan:   Problem List Items Addressed This Visit       Respiratory   Cystic fibrosis   Relevant Medications   Pancrelipase, Lip-Prot-Amyl, (PANCREAZE) 4200-14200 units CPEP     Endocrine   Type 2 diabetes mellitus with hyperglycemia, with long-term current use of insulin  Relevant Medications   HUMULIN N 100 UNIT/ML injection     Other   S/P lung transplant - Primary   Hyperlipidemia    No follow-ups on file.   Orson Eva, NP

## 2023-03-24 NOTE — Addendum Note (Signed)
Addended by: Orson Eva on: 03/24/2023 02:55 PM   Modules accepted: Orders

## 2023-03-24 NOTE — Patient Instructions (Signed)
1) Refills as highlighted on patient's computer printed  2) Follow up appt in 1 week

## 2023-03-29 ENCOUNTER — Other Ambulatory Visit: Payer: Self-pay | Admitting: Internal Medicine

## 2023-03-29 DIAGNOSIS — M810 Age-related osteoporosis without current pathological fracture: Secondary | ICD-10-CM

## 2023-03-31 ENCOUNTER — Encounter: Payer: Self-pay | Admitting: Nurse Practitioner

## 2023-03-31 ENCOUNTER — Ambulatory Visit (INDEPENDENT_AMBULATORY_CARE_PROVIDER_SITE_OTHER): Payer: 59 | Admitting: Nurse Practitioner

## 2023-03-31 VITALS — BP 108/74 | HR 79 | Ht 66.0 in | Wt 160.4 lb

## 2023-03-31 DIAGNOSIS — I158 Other secondary hypertension: Secondary | ICD-10-CM

## 2023-03-31 DIAGNOSIS — E1165 Type 2 diabetes mellitus with hyperglycemia: Secondary | ICD-10-CM

## 2023-03-31 DIAGNOSIS — L709 Acne, unspecified: Secondary | ICD-10-CM

## 2023-03-31 DIAGNOSIS — Z942 Lung transplant status: Secondary | ICD-10-CM

## 2023-03-31 DIAGNOSIS — E119 Type 2 diabetes mellitus without complications: Secondary | ICD-10-CM

## 2023-03-31 DIAGNOSIS — Z794 Long term (current) use of insulin: Secondary | ICD-10-CM

## 2023-03-31 MED ORDER — TRETINOIN 0.1 % EX CREA
TOPICAL_CREAM | Freq: Every day | CUTANEOUS | 3 refills | Status: DC
Start: 2023-03-31 — End: 2023-06-12

## 2023-03-31 MED ORDER — LANTUS SOLOSTAR 100 UNIT/ML ~~LOC~~ SOPN: PEN_INJECTOR | SUBCUTANEOUS | 3 refills | Status: DC

## 2023-03-31 MED ORDER — PREDNISONE 5 MG PO TABS
15.0000 mg | ORAL_TABLET | ORAL | 3 refills | Status: AC
Start: 2023-03-31 — End: 2023-06-29

## 2023-03-31 MED ORDER — ATENOLOL 25 MG PO TABS: 25.0000 mg | ORAL_TABLET | Freq: Every day | ORAL | 3 refills | Status: DC

## 2023-03-31 MED ORDER — INSULIN SYRINGE-NEEDLE U-100 30G X 5/16" 0.3 ML MISC
6.0000 | 3 refills | Status: DC
Start: 2023-03-31 — End: 2023-04-07

## 2023-03-31 NOTE — Progress Notes (Signed)
Established Patient Office Visit  Subjective:  Patient ID: Terrence Brown, male    DOB: 03/15/1961  Age: 62 y.o. MRN: [id number]  Chief Complaint  Patient presents with   Follow-up    1 week follow up    1 week follow up for new patient establish care.  Some refills were not received by [facility].  Wants insulin vials with insulin syringes.    No other concerns at this time.   Past Medical History:  Diagnosis Date   Cystic fibrosis (HCC)    Diabetes mellitus without complication (HCC)    Osteopenia    Sleep apnea    TIA (transient ischemic attack)     Past Surgical History:  Procedure Laterality Date   COLONOSCOPY N/A 11/09/2015   Procedure: COLONOSCOPY;  Surgeon: Elnita Maxwell, MD;  Location: [facility] ENDOSCOPY;  Service: Endoscopy;  Laterality: N/A;   Per office, patient HAS TO be 1st case - IDDM   knee arthoscopy Right    lung transplant Bilateral     Social History   Socioeconomic History   Marital status: Married    Spouse name: Not on file   Number of children: Not on file   Years of education: Not on file   Highest education level: Not on file  Occupational History   Not on file  Tobacco Use   Smoking status: Never   Smokeless tobacco: Never  Substance and Sexual Activity   Alcohol use: No   Drug use: No   Sexual activity: Yes  Other Topics Concern   Not on file  Social History Narrative   Not on file   Social Determinants of Health   Financial Resource Strain: Not on file  Food Insecurity: Not on file  Transportation Needs: Not on file  Physical Activity: Not on file  Stress: Not on file  Social Connections: Not on file  Intimate Partner Violence: Not on file    No family history on file.  Allergies  Allergen Reactions   Percocet [Oxycodone-Acetaminophen]    Sulfa Antibiotics     Review of Systems  Constitutional: Negative.   HENT: Negative.    Eyes: Negative.   Respiratory: Negative.    Cardiovascular:  Negative.   Gastrointestinal: Negative.   Genitourinary: Negative.   Musculoskeletal: Negative.   Skin: Negative.   Neurological: Negative.   Endo/Heme/Allergies: Negative.   Psychiatric/Behavioral: Negative.         Objective:   There were no vitals taken for this visit.  There were no vitals filed for this visit.  Physical Exam Vitals reviewed.  Constitutional:      Appearance: Normal appearance.  HENT:     Head: Normocephalic.     Nose: Nose normal.     Mouth/Throat:     Mouth: Mucous membranes are moist.  Eyes:     Pupils: Pupils are equal, round, and reactive to light.  Cardiovascular:     Rate and Rhythm: Normal rate and regular rhythm.  Pulmonary:     Effort: Pulmonary effort is normal.     Breath sounds: Normal breath sounds.  Abdominal:     General: Bowel sounds are normal.     Palpations: Abdomen is soft.  Musculoskeletal:        General: Tenderness present.     Cervical back: Normal range of motion and neck supple.  Skin:    General: Skin is warm and dry.  Neurological:     Mental Status: He is alert and oriented  to person, place, and time.  Psychiatric:        Mood and Affect: Mood normal.        Behavior: Behavior normal.      No results found for any visits on 03/31/23.  Recent Results (from the past 2160 hour(s))  Basic Metabolic Panel (BMET)     Status: Abnormal   Collection Time: 02/02/23  4:02 PM  Result Value Ref Range   Glucose, Bld 168 (H) 65 - 99 mg/dL    Comment: .            Fasting reference interval . For someone without known diabetes, a glucose value >125 mg/dL indicates that they may have diabetes and this should be confirmed with a follow-up test. .    BUN 13 7 - 25 mg/dL   Creat 1.61 (H) 0.96 - 1.35 mg/dL   BUN/Creatinine Ratio 9 6 - 22 (calc)   Sodium 133 (L) 135 - 146 mmol/L   Potassium 4.0 3.5 - 5.3 mmol/L   Chloride 99 98 - 110 mmol/L   CO2 23 20 - 32 mmol/L   Calcium 9.3 8.6 - 10.3 mg/dL  CBC  w/Diff/Platelet     Status: None   Collection Time: 02/02/23  4:02 PM  Result Value Ref Range   WBC 7.2 3.8 - 10.8 Thousand/uL   RBC 4.36 4.20 - 5.80 Million/uL   Hemoglobin 13.7 13.2 - 17.1 g/dL   HCT 04.5 40.9 - 81.1 %   MCV 90.6 80.0 - 100.0 fL   MCH 31.4 27.0 - 33.0 pg   MCHC 34.7 32.0 - 36.0 g/dL   RDW 91.4 78.2 - 95.6 %   Platelets 245 140 - 400 Thousand/uL   MPV 11.4 7.5 - 12.5 fL   Neutro Abs 4,306 1,500 - 7,800 cells/uL   Lymphs Abs 1,980 850 - 3,900 cells/uL   Absolute Monocytes 626 200 - 950 cells/uL   Eosinophils Absolute 238 15 - 500 cells/uL   Basophils Absolute 50 0 - 200 cells/uL   Neutrophils Relative % 59.8 %   Total Lymphocyte 27.5 %   Monocytes Relative 8.7 %   Eosinophils Relative 3.3 %   Basophils Relative 0.7 %  HgB A1c     Status: Abnormal   Collection Time: 02/02/23  4:02 PM  Result Value Ref Range   Hgb A1c MFr Bld 7.3 (H) <5.7 % of total Hgb    Comment: For someone without known diabetes, a hemoglobin A1c value of 6.5% or greater indicates that they may have  diabetes and this should be confirmed with a follow-up  test. . For someone with known diabetes, a value <7% indicates  that their diabetes is well controlled and a value  greater than or equal to 7% indicates suboptimal  control. A1c targets should be individualized based on  duration of diabetes, age, comorbid conditions, and  other considerations. . Currently, no consensus exists regarding use of hemoglobin A1c for diagnosis of diabetes for children. .    Mean Plasma Glucose 163 mg/dL   eAG (mmol/L) 9.0 mmol/L    Comment: . HbA1c performed on Roche platform. Effective 09/08/22 a change in test platforms may have  shifted HbA1c results compared to historical results.   Tacrolimus,Highly Sensitive,LC/MS/MS     Status: None   Collection Time: 02/02/23  4:02 PM  Result Value Ref Range   Tacrolimus Lvl 8.4 mcg/L    Comment: No definitive therapeutic or toxic ranges have  been established. Optimal blood drug levels  are influenced by type of transplant, patient response, time post- transplant, co-administration of other drugs, and  drug formulation. The following trough range is a  suggested guideline: 5.0-20.0 mcg/L. Marland Kitchen This test was developed and its analytical performance  characteristics have been determined by [employer]. It has not been cleared or approved by the FDA. This assay has been validated pursuant to the CLIA  regulations and is used for clinical purposes.       Assessment & Plan:   Problem List Items Addressed This Visit   None   No follow-ups on file.   Total time spent: 40 minutes  Orson Eva, NP  03/31/2023

## 2023-03-31 NOTE — Patient Instructions (Addendum)
1) Fasting labs prior to next appt June 6rh 2) Pt needs appt on 7th or 10th (has to be 30 min appt)

## 2023-03-31 NOTE — Telephone Encounter (Signed)
Requested by interface surescripts. Under another provider care Konrad Dolores, NP. No longer under E. Caralee Ates , DO care. Requested Prescriptions  Refused Prescriptions Disp Refills   alendronate (FOSAMAX) 70 MG tablet [Pharmacy Med Name: Alendronate Sodium 70 MG Oral Tablet] 12 tablet 3    Sig: TAKE 1 TABLET BY MOUTH WEEKLY  TAKE WITH A FULL GLASS OF WATER  ON AN EMPTY STOMACH     Endocrinology:  Bisphosphonates Failed - 03/29/2023 10:34 PM      Failed - Vitamin D in normal range and within 360 days    No results found for: "ZO1096EA5", "WU9811BJ4", "VD125OH2TOT", "25OHVITD3", "25OHVITD2", "25OHVITD1", "VD25OH"       Failed - Cr in normal range and within 360 days    Creat  Date Value Ref Range Status  02/02/2023 1.39 (H) 0.70 - 1.35 mg/dL Final         Failed - Mg Level in normal range and within 360 days    No results found for: "MG"       Failed - Phosphate in normal range and within 360 days    No results found for: "PHOS"       Failed - eGFR is 30 or above and within 360 days    GFR calc Af Amer  Date Value Ref Range Status  08/19/2018 58 (L) >60 mL/min Final    Comment:    (NOTE) The eGFR has been calculated using the CKD EPI equation. This calculation has not been validated in all clinical situations. eGFR's persistently <60 mL/min signify possible Chronic Kidney Disease.    GFR calc non Af Amer  Date Value Ref Range Status  08/19/2018 50 (L) >60 mL/min Final         Failed - Bone Mineral Density or Dexa Scan completed in the last 2 years      Passed - Ca in normal range and within 360 days    Calcium  Date Value Ref Range Status  02/02/2023 9.3 8.6 - 10.3 mg/dL Final         Passed - Valid encounter within last 12 months    Recent Outpatient Visits           1 month ago Type 2 diabetes mellitus with hyperglycemia, with long-term current use of insulin [facility])   [facility] Margarita Mail, DO       Future Appointments              Today Orson Eva, NP [facility]

## 2023-04-07 ENCOUNTER — Ambulatory Visit: Payer: 59 | Admitting: Nurse Practitioner

## 2023-04-07 ENCOUNTER — Other Ambulatory Visit: Payer: Self-pay | Admitting: Nurse Practitioner

## 2023-04-07 DIAGNOSIS — E119 Type 2 diabetes mellitus without complications: Secondary | ICD-10-CM

## 2023-04-07 NOTE — Telephone Encounter (Signed)
Patient left VM that [facility] needs clarification on his prescription for his insulin syringes. It was accidentally sent as 6 syringes every 4 hours. I have corrected it and pended the order, if you would just approve that what I put is correct and send on to [facility] please.

## 2023-04-08 MED ORDER — "INSULIN SYRINGE-NEEDLE U-100 30G X 5/16"" 0.3 ML MISC": 1.0000 | 3 refills | Status: DC

## 2023-04-15 ENCOUNTER — Other Ambulatory Visit: Payer: Self-pay | Admitting: Internal Medicine

## 2023-04-15 DIAGNOSIS — M810 Age-related osteoporosis without current pathological fracture: Secondary | ICD-10-CM

## 2023-04-16 NOTE — Telephone Encounter (Signed)
I forwared this refill request to Bethanie Dicker NP. Pt is no longer going to see her.I called and lvm for pt to call back to see if he was still coming here for his care under Dr Caralee Ates. Waiting for a call back

## 2023-04-16 NOTE — Telephone Encounter (Signed)
Requested medication (s) are due for refill today: yes  Requested medication (s) are on the active medication list: yes  Last refill:  02/02/23  Future visit scheduled: no  Notes to clinic:  Unable to refill per protocol,  another PCP listed in chart, but patient had OV with Dr. Caralee Ates 02/02/23. Routing for review.      Requested Prescriptions  Pending Prescriptions Disp Refills   alendronate (FOSAMAX) 70 MG tablet [Pharmacy Med Name: Alendronate Sodium 70 MG Oral Tablet] 12 tablet 3    Sig: TAKE 1 TABLET BY MOUTH WEEKLY  TAKE WITH A FULL GLASS OF WATER  ON AN EMPTY STOMACH     Endocrinology:  Bisphosphonates Failed - 04/15/2023 10:50 PM      Failed - Vitamin D in normal range and within 360 days    No results found for: "UJ8119JY7", "WG9562ZH0", "VD125OH2TOT", "25OHVITD3", "25OHVITD2", "25OHVITD1", "VD25OH"       Failed - Cr in normal range and within 360 days    Creat  Date Value Ref Range Status  02/02/2023 1.39 (H) 0.70 - 1.35 mg/dL Final         Failed - Mg Level in normal range and within 360 days    No results found for: "MG"       Failed - Phosphate in normal range and within 360 days    No results found for: "PHOS"       Failed - eGFR is 30 or above and within 360 days    GFR calc Af Amer  Date Value Ref Range Status  08/19/2018 58 (L) >60 mL/min Final    Comment:    (NOTE) The eGFR has been calculated using the CKD EPI equation. This calculation has not been validated in all clinical situations. eGFR's persistently <60 mL/min signify possible Chronic Kidney Disease.    GFR calc non Af Amer  Date Value Ref Range Status  08/19/2018 50 (L) >60 mL/min Final         Failed - Bone Mineral Density or Dexa Scan completed in the last 2 years      Passed - Ca in normal range and within 360 days    Calcium  Date Value Ref Range Status  02/02/2023 9.3 8.6 - 10.3 mg/dL Final         Passed - Valid encounter within last 12 months    Recent Outpatient Visits            2 months ago Type 2 diabetes mellitus with hyperglycemia, with long-term current use of insulin [facility])   [facility] Margarita Mail, DO       Future Appointments             In 3 weeks Orson Eva, NP [facility]

## 2023-05-12 ENCOUNTER — Ambulatory Visit (INDEPENDENT_AMBULATORY_CARE_PROVIDER_SITE_OTHER): Payer: 59 | Admitting: Internal Medicine

## 2023-05-12 ENCOUNTER — Encounter: Payer: Self-pay | Admitting: Internal Medicine

## 2023-05-12 VITALS — BP 134/60 | HR 82 | Ht 66.0 in | Wt 160.0 lb

## 2023-05-12 DIAGNOSIS — Z942 Lung transplant status: Secondary | ICD-10-CM

## 2023-05-12 DIAGNOSIS — Z794 Long term (current) use of insulin: Secondary | ICD-10-CM | POA: Diagnosis not present

## 2023-05-12 DIAGNOSIS — E782 Mixed hyperlipidemia: Secondary | ICD-10-CM | POA: Diagnosis not present

## 2023-05-12 DIAGNOSIS — E1165 Type 2 diabetes mellitus with hyperglycemia: Secondary | ICD-10-CM | POA: Diagnosis not present

## 2023-05-12 LAB — POCT CBG (FASTING - GLUCOSE)-MANUAL ENTRY: Glucose Fasting, POC: 316 mg/dL — AB (ref 70–99)

## 2023-05-12 NOTE — Progress Notes (Signed)
Established Patient Office Visit  Subjective:  Patient ID: Terrence Brown, male    DOB: Apr 10, 1961  Age: 62 y.o. MRN: [id number]  Chief Complaint  Patient presents with   Follow-up    1 week follow up    Patient forgot that he was supposed to be here today - did not get labs as thought his appointment is in July. Wants to get blood work today and return in 1-2 weeks to see Chelsa.    No other concerns at this time.   Past Medical History:  Diagnosis Date   Cystic fibrosis (HCC)    Diabetes mellitus without complication (HCC)    Osteopenia    Sleep apnea    TIA (transient ischemic attack)     Past Surgical History:  Procedure Laterality Date   COLONOSCOPY N/A 11/09/2015   Procedure: COLONOSCOPY;  Surgeon: Elnita Maxwell, MD;  Location: [facility] ENDOSCOPY;  Service: Endoscopy;  Laterality: N/A;   Per office, patient HAS TO be 1st case - IDDM   knee arthoscopy Right    lung transplant Bilateral     Social History   Socioeconomic History   Marital status: Married    Spouse name: Not on file   Number of children: Not on file   Years of education: Not on file   Highest education level: Not on file  Occupational History   Not on file  Tobacco Use   Smoking status: Never   Smokeless tobacco: Never  Substance and Sexual Activity   Alcohol use: No   Drug use: No   Sexual activity: Yes  Other Topics Concern   Not on file  Social History Narrative   Not on file   Social Determinants of Health   Financial Resource Strain: Not on file  Food Insecurity: Not on file  Transportation Needs: Not on file  Physical Activity: Not on file  Stress: Not on file  Social Connections: Not on file  Intimate Partner Violence: Not on file    History reviewed. No pertinent family history.  Allergies  Allergen Reactions   Percocet [Oxycodone-Acetaminophen]    Sulfa Antibiotics     ROS     Objective:   BP 134/60   Pulse 82   Ht 5\' 6"  (1.676 m)   Wt 160 lb  (72.6 kg)   SpO2 98%   BMI 25.82 kg/m   Vitals:   05/12/23 1441  BP: 134/60  Pulse: 82  Height: 5\' 6"  (1.676 m)  Weight: 160 lb (72.6 kg)  SpO2: 98%  BMI (Calculated): 25.84    Physical Exam   Results for orders placed or performed in visit on 05/12/23  POCT CBG (Fasting - Glucose)  Result Value Ref Range   Glucose Fasting, POC 316 (A) 70 - 99 mg/dL    Recent Results (from the past 2160 hour(s))  POCT CBG (Fasting - Glucose)     Status: Abnormal   Collection Time: 05/12/23  2:53 PM  Result Value Ref Range   Glucose Fasting, POC 316 (A) 70 - 99 mg/dL      Assessment & Plan:   Problem List Items Addressed This Visit     S/P lung transplant (HCC)   Relevant Orders   CBC With Differential   Tacrolimus Level   Hyperlipidemia   Relevant Orders   Lipid Panel w/o Chol/HDL Ratio   Type 2 diabetes mellitus with hyperglycemia, with long-term current use of insulin (HCC) - Primary   Relevant Orders   POCT CBG (  Fasting - Glucose) (Completed)   CMP14+EGFR   Hemoglobin A1c    Return in about 1 week (around 05/19/2023).   Total time spent: 10 minutes  Margaretann Loveless, MD  05/12/2023   This document may have been prepared by [facility] Voice Recognition software and as such may include unintentional dictation errors.

## 2023-05-13 LAB — LIPID PANEL W/O CHOL/HDL RATIO
Cholesterol, Total: 162 mg/dL (ref 100–199)
HDL: 54 mg/dL (ref >39)
LDL Chol Calc (NIH): 89 mg/dL (ref 0–99)
Triglycerides: 102 mg/dL (ref 0–149)
VLDL Cholesterol Cal: 19 mg/dL (ref 5–40)

## 2023-05-13 LAB — CBC WITH DIFFERENTIAL
Basophils Absolute: 0 10*3/uL (ref 0.0–0.2)
Basos: 0 %
EOS (ABSOLUTE): 0 10*3/uL (ref 0.0–0.4)
Eos: 0 %
Hematocrit: 41 % (ref 37.5–51.0)
Hemoglobin: 13.9 g/dL (ref 13.0–17.7)
Immature Grans (Abs): 0 10*3/uL (ref 0.0–0.1)
Immature Granulocytes: 0 %
Lymphocytes Absolute: 1 10*3/uL (ref 0.7–3.1)
Lymphs: 14 %
MCH: 31.6 pg (ref 26.6–33.0)
MCHC: 33.9 g/dL (ref 31.5–35.7)
MCV: 93 fL (ref 79–97)
Monocytes Absolute: 0.4 10*3/uL (ref 0.1–0.9)
Monocytes: 5 %
Neutrophils Absolute: 5.8 10*3/uL (ref 1.4–7.0)
Neutrophils: 81 %
RBC: 4.4 x10E6/uL (ref 4.14–5.80)
RDW: 11.9 % (ref 11.6–15.4)
WBC: 7.2 10*3/uL (ref 3.4–10.8)

## 2023-05-13 LAB — CMP14+EGFR
ALT: 18 IU/L (ref 0–44)
AST: 22 IU/L (ref 0–40)
Albumin/Globulin Ratio: 1.6
Albumin: 4.2 g/dL (ref 3.8–4.9)
Alkaline Phosphatase: 54 IU/L (ref 44–121)
BUN/Creatinine Ratio: 11 (ref 10–24)
BUN: 15 mg/dL (ref 8–27)
Bilirubin Total: 0.5 mg/dL (ref 0.0–1.2)
CO2: 19 mmol/L — ABNORMAL LOW (ref 20–29)
Calcium: 9.3 mg/dL (ref 8.6–10.2)
Chloride: 92 mmol/L — ABNORMAL LOW (ref 96–106)
Creatinine, Ser: 1.41 mg/dL — ABNORMAL HIGH (ref 0.76–1.27)
Globulin, Total: 2.6 g/dL (ref 1.5–4.5)
Glucose: 331 mg/dL — ABNORMAL HIGH (ref 70–99)
Potassium: 4.9 mmol/L (ref 3.5–5.2)
Sodium: 129 mmol/L — ABNORMAL LOW (ref 134–144)
Total Protein: 6.8 g/dL (ref 6.0–8.5)
eGFR: 57 mL/min/{1.73_m2} — ABNORMAL LOW (ref >59)

## 2023-05-13 LAB — HEMOGLOBIN A1C
Est. average glucose Bld gHb Est-mCnc: 154 mg/dL
Hgb A1c MFr Bld: 7 % — ABNORMAL HIGH (ref 4.8–5.6)

## 2023-05-15 LAB — TACROLIMUS LEVEL: Tacrolimus (FK506), Blood: 11.5 ng/mL (ref 2.0–20.0)

## 2023-05-26 ENCOUNTER — Ambulatory Visit: Payer: 59 | Admitting: Nurse Practitioner

## 2023-06-01 ENCOUNTER — Encounter: Payer: Self-pay | Admitting: Nurse Practitioner

## 2023-06-02 ENCOUNTER — Ambulatory Visit: Payer: Medicare Other | Admitting: Nurse Practitioner

## 2023-06-12 ENCOUNTER — Other Ambulatory Visit: Payer: Self-pay

## 2023-06-12 DIAGNOSIS — Z942 Lung transplant status: Secondary | ICD-10-CM

## 2023-06-12 DIAGNOSIS — L709 Acne, unspecified: Secondary | ICD-10-CM

## 2023-06-12 MED ORDER — TACROLIMUS 1 MG PO CAPS
3.0000 mg | ORAL_CAPSULE | Freq: Two times a day (BID) | ORAL | 6 refills | Status: DC
Start: 2023-06-12 — End: 2024-08-25

## 2023-06-12 MED ORDER — TRETINOIN 0.1 % EX CREA
TOPICAL_CREAM | Freq: Every day | CUTANEOUS | 6 refills | Status: AC
Start: 2023-06-12 — End: ?

## 2023-06-12 MED ORDER — INSULIN GLARGINE 100 UNIT/ML ~~LOC~~ SOLN: SUBCUTANEOUS | 3 refills | Status: DC

## 2023-07-13 ENCOUNTER — Other Ambulatory Visit: Payer: Self-pay

## 2023-07-14 ENCOUNTER — Other Ambulatory Visit: Payer: Self-pay | Admitting: Cardiology

## 2023-07-14 DIAGNOSIS — M810 Age-related osteoporosis without current pathological fracture: Secondary | ICD-10-CM

## 2023-07-14 MED ORDER — ALENDRONATE SODIUM 70 MG PO TABS: 70.0000 mg | ORAL_TABLET | ORAL | 0 refills | Status: DC

## 2023-08-04 ENCOUNTER — Ambulatory Visit (INDEPENDENT_AMBULATORY_CARE_PROVIDER_SITE_OTHER): Payer: 59 | Admitting: Internal Medicine

## 2023-08-04 ENCOUNTER — Encounter: Payer: Self-pay | Admitting: Internal Medicine

## 2023-08-04 VITALS — BP 142/92 | HR 87 | Ht 66.0 in | Wt 163.0 lb

## 2023-08-04 DIAGNOSIS — I10 Essential (primary) hypertension: Secondary | ICD-10-CM | POA: Diagnosis not present

## 2023-08-04 DIAGNOSIS — Z794 Long term (current) use of insulin: Secondary | ICD-10-CM

## 2023-08-04 DIAGNOSIS — E1165 Type 2 diabetes mellitus with hyperglycemia: Secondary | ICD-10-CM

## 2023-08-04 DIAGNOSIS — E782 Mixed hyperlipidemia: Secondary | ICD-10-CM

## 2023-08-04 DIAGNOSIS — Z942 Lung transplant status: Secondary | ICD-10-CM

## 2023-08-04 LAB — POCT CBG (FASTING - GLUCOSE)-MANUAL ENTRY: Glucose Fasting, POC: 351 mg/dL — AB (ref 70–99)

## 2023-08-04 NOTE — Progress Notes (Signed)
Established Patient Office Visit  Subjective:  Patient ID: Terrence Brown, male    DOB: 10-07-1961  Age: 62 y.o. MRN: [id number]  Chief Complaint  Patient presents with   Follow-up    Medication refills    Patient is here for his follow-up.  He is generally feeling stable except for minor shortness of breath today.  His O2 sats at room air is 98%.  Denies chest pain or cough, no fever and no chills. His labs are not due for another week and will return fasting for them. His fingerstick glucose is high but he has not taken his insulin yet. Patient requests a referral to endocrinologist but he does not want to go back to [place] clinic.  He also needs to see a pulmonologist. Will check with his wife about referrals to be made, will let us know. His blood pressure is high today, currently he is only on atenolol.  Will check his kidney function and consider referral to nephrologist.    No other concerns at this time.   Past Medical History:  Diagnosis Date   Cystic fibrosis (HCC)    Diabetes mellitus without complication (HCC)    Osteopenia    Sleep apnea    TIA (transient ischemic attack)     Past Surgical History:  Procedure Laterality Date   COLONOSCOPY N/A 11/09/2015   Procedure: COLONOSCOPY;  Surgeon: Elnita Maxwell, MD;  Location: [facility] ENDOSCOPY;  Service: Endoscopy;  Laterality: N/A;   Per office, patient HAS TO be 1st case - IDDM   knee arthoscopy Right    lung transplant Bilateral     Social History   Socioeconomic History   Marital status: Married    Spouse name: Not on file   Number of children: Not on file   Years of education: Not on file   Highest education level: Not on file  Occupational History   Not on file  Tobacco Use   Smoking status: Never   Smokeless tobacco: Never  Substance and Sexual Activity   Alcohol use: No   Drug use: No   Sexual activity: Yes  Other Topics Concern   Not on file  Social History Narrative   Not on file    Social Determinants of Health   Financial Resource Strain: Not on file  Food Insecurity: Not on file  Transportation Needs: Not on file  Physical Activity: Not on file  Stress: Not on file  Social Connections: Not on file  Intimate Partner Violence: Not on file    History reviewed. No pertinent family history.  Allergies  Allergen Reactions   Percocet [Oxycodone-Acetaminophen]    Sulfa Antibiotics     Review of Systems  Constitutional: Negative.  Negative for chills, diaphoresis, fever, malaise/fatigue and weight loss.  HENT: Negative.    Eyes: Negative.   Respiratory: Negative.  Negative for cough, shortness of breath and wheezing.   Cardiovascular: Negative.  Negative for chest pain, palpitations and leg swelling.  Gastrointestinal: Negative.  Negative for abdominal pain, constipation, diarrhea, heartburn, nausea and vomiting.  Genitourinary: Negative.  Negative for dysuria and flank pain.  Musculoskeletal: Negative.  Negative for joint pain and myalgias.  Skin: Negative.   Neurological: Negative.  Negative for dizziness and headaches.  Endo/Heme/Allergies: Negative.   Psychiatric/Behavioral: Negative.  Negative for depression and suicidal ideas. The patient is not nervous/anxious.        Objective:   BP (!) 142/92   Pulse 87   Ht 5\' 6"  (1.676 m)  Wt 163 lb (73.9 kg)   SpO2 98%   BMI 26.31 kg/m   Vitals:   08/04/23 1453  BP: (!) 142/92  Pulse: 87  Height: 5\' 6"  (1.676 m)  Weight: 163 lb (73.9 kg)  SpO2: 98%  BMI (Calculated): 26.32    Physical Exam Vitals and nursing note reviewed.  Constitutional:      Appearance: Normal appearance.  HENT:     Head: Normocephalic and atraumatic.     Nose: Nose normal.     Mouth/Throat:     Mouth: Mucous membranes are moist.     Pharynx: Oropharynx is clear.  Eyes:     Conjunctiva/sclera: Conjunctivae normal.     Pupils: Pupils are equal, round, and reactive to light.  Cardiovascular:     Rate and Rhythm:  Normal rate and regular rhythm.     Pulses: Normal pulses.     Heart sounds: Normal heart sounds.  Pulmonary:     Effort: Pulmonary effort is normal.     Breath sounds: Normal breath sounds. No wheezing, rhonchi or rales.  Chest:     Chest wall: No tenderness.  Abdominal:     General: Bowel sounds are normal.     Palpations: Abdomen is soft. There is no mass.     Tenderness: There is guarding. There is no right CVA tenderness, left CVA tenderness or rebound.  Musculoskeletal:        General: No deformity or signs of injury. Normal range of motion.     Cervical back: Normal range of motion.     Right lower leg: No edema.     Left lower leg: No edema.  Skin:    General: Skin is warm and dry.  Neurological:     General: No focal deficit present.     Mental Status: He is alert and oriented to person, place, and time.  Psychiatric:        Mood and Affect: Mood normal.        Behavior: Behavior normal.        Judgment: Judgment normal.      Results for orders placed or performed in visit on 08/04/23  POCT CBG (Fasting - Glucose)  Result Value Ref Range   Glucose Fasting, POC 351 (A) 70 - 99 mg/dL    Recent Results (from the past 2160 hour(s))  POCT CBG (Fasting - Glucose)     Status: Abnormal   Collection Time: 05/12/23  2:53 PM  Result Value Ref Range   Glucose Fasting, POC 316 (A) 70 - 99 mg/dL  CBC With Differential     Status: None   Collection Time: 05/12/23  3:45 PM  Result Value Ref Range   WBC 7.2 3.4 - 10.8 x10E3/uL   RBC 4.40 4.14 - 5.80 x10E6/uL   Hemoglobin 13.9 13.0 - 17.7 g/dL   Hematocrit 40.9 81.1 - 51.0 %   MCV 93 79 - 97 fL   MCH 31.6 26.6 - 33.0 pg   MCHC 33.9 31.5 - 35.7 g/dL   RDW 91.4 78.2 - 95.6 %   Neutrophils 81 Not Estab. %   Lymphs 14 Not Estab. %   Monocytes 5 Not Estab. %   Eos 0 Not Estab. %   Basos 0 Not Estab. %   Neutrophils Absolute 5.8 1.4 - 7.0 x10E3/uL   Lymphocytes Absolute 1.0 0.7 - 3.1 x10E3/uL   Monocytes Absolute 0.4 0.1 -  0.9 x10E3/uL   EOS (ABSOLUTE) 0.0 0.0 - 0.4 x10E3/uL  Basophils Absolute 0.0 0.0 - 0.2 x10E3/uL   Immature Granulocytes 0 Not Estab. %   Immature Grans (Abs) 0.0 0.0 - 0.1 x10E3/uL    Comment: **Effective June 29, 2023, profile [id number] CBC/Differential**   (No Platelet) will be made non-orderable. [facility] Offers:   [id number] CBC With Differential/Platelet   CMP14+EGFR     Status: Abnormal   Collection Time: 05/12/23  3:45 PM  Result Value Ref Range   Glucose 331 (H) 70 - 99 mg/dL   BUN 15 8 - 27 mg/dL   Creatinine, Ser 9.14 (H) 0.76 - 1.27 mg/dL   eGFR 57 (L) >78 GN/FAO/1.30   BUN/Creatinine Ratio 11 10 - 24   Sodium 129 (L) 134 - 144 mmol/L   Potassium 4.9 3.5 - 5.2 mmol/L   Chloride 92 (L) 96 - 106 mmol/L   CO2 19 (L) 20 - 29 mmol/L   Calcium 9.3 8.6 - 10.2 mg/dL   Total Protein 6.8 6.0 - 8.5 g/dL   Albumin 4.2 3.8 - 4.9 g/dL   Globulin, Total 2.6 1.5 - 4.5 g/dL   Albumin/Globulin Ratio 1.6    Bilirubin Total 0.5 0.0 - 1.2 mg/dL   Alkaline Phosphatase 54 44 - 121 IU/L   AST 22 0 - 40 IU/L   ALT 18 0 - 44 IU/L  Lipid Panel w/o Chol/HDL Ratio     Status: None   Collection Time: 05/12/23  3:45 PM  Result Value Ref Range   Cholesterol, Total 162 100 - 199 mg/dL   Triglycerides 865 0 - 149 mg/dL   HDL 54 >78 mg/dL   VLDL Cholesterol Cal 19 5 - 40 mg/dL   LDL Chol Calc (NIH) 89 0 - 99 mg/dL  Hemoglobin I6N     Status: Abnormal   Collection Time: 05/12/23  3:45 PM  Result Value Ref Range   Hgb A1c MFr Bld 7.0 (H) 4.8 - 5.6 %    Comment:          Prediabetes: 5.7 - 6.4          Diabetes: >6.4          Glycemic control for adults with diabetes: <7.0    Est. average glucose Bld gHb Est-mCnc 154 mg/dL  Tacrolimus Level     Status: None   Collection Time: 05/12/23  4:46 PM  Result Value Ref Range   Tacrolimus (FK506), Blood 11.5 2.0 - 20.0 ng/mL    Comment:         Trough (immediately following                 transplant)                       15.0         Trough (steady  state, 2 weeks or                 more after transplant):      3.0 - 8.0         Performed by LC-MS/MS technology.   POCT CBG (Fasting - Glucose)     Status: Abnormal   Collection Time: 08/04/23  2:59 PM  Result Value Ref Range   Glucose Fasting, POC 351 (A) 70 - 99 mg/dL      Assessment & Plan:  Continue all his current medications for now. Monitor blood pressure. Labs next week. Referrals will be sent to endocrine, pulmonary, if patient wants to go to [place]. Problem List Items  Addressed This Visit     S/P lung transplant (HCC)   Relevant Medications   predniSONE (DELTASONE) 5 MG tablet   Other Relevant Orders   CBC with Diff   Tacrolimus(fk506) to [facility] lab   Hyperlipidemia   Relevant Orders   Lipid Panel w/o Chol/HDL Ratio   CMP14+EGFR   Type 2 diabetes mellitus with hyperglycemia, with long-term current use of insulin (HCC) - Primary   Relevant Orders   POCT CBG (Fasting - Glucose) (Completed)   Hemoglobin A1c   Other Visit Diagnoses     Essential hypertension, benign       Relevant Orders   CBC with Diff       Return in about 4 months (around 12/04/2023).   Total time spent: 30 minutes  Margaretann Loveless, MD  08/04/2023   This document may have been prepared by [facility] Voice Recognition software and as such may include unintentional dictation errors.

## 2023-08-10 ENCOUNTER — Other Ambulatory Visit: Payer: Self-pay

## 2023-08-10 MED ORDER — PREDNISONE 5 MG PO TABS: 15.0000 mg | ORAL_TABLET | ORAL | 2 refills | Status: DC

## 2023-08-13 ENCOUNTER — Other Ambulatory Visit: Payer: 59

## 2023-08-13 ENCOUNTER — Other Ambulatory Visit: Payer: Self-pay | Admitting: Family

## 2023-08-13 DIAGNOSIS — Z942 Lung transplant status: Secondary | ICD-10-CM

## 2023-08-14 LAB — CMP14+EGFR
ALT: 14 IU/L (ref 0–44)
AST: 20 IU/L (ref 0–40)
Albumin: 4 g/dL (ref 3.9–4.9)
Alkaline Phosphatase: 49 IU/L (ref 44–121)
BUN/Creatinine Ratio: 10 (ref 10–24)
BUN: 13 mg/dL (ref 8–27)
Bilirubin Total: 0.6 mg/dL (ref 0.0–1.2)
CO2: 22 mmol/L (ref 20–29)
Calcium: 9 mg/dL (ref 8.6–10.2)
Chloride: 96 mmol/L (ref 96–106)
Creatinine, Ser: 1.36 mg/dL — ABNORMAL HIGH (ref 0.76–1.27)
Globulin, Total: 2.6 g/dL (ref 1.5–4.5)
Glucose: 144 mg/dL — ABNORMAL HIGH (ref 70–99)
Potassium: 4.1 mmol/L (ref 3.5–5.2)
Sodium: 133 mmol/L — ABNORMAL LOW (ref 134–144)
Total Protein: 6.6 g/dL (ref 6.0–8.5)
eGFR: 59 mL/min/{1.73_m2} — ABNORMAL LOW (ref >59)

## 2023-08-14 LAB — HEMOGLOBIN A1C
Est. average glucose Bld gHb Est-mCnc: 146 mg/dL
Hgb A1c MFr Bld: 6.7 % — ABNORMAL HIGH (ref 4.8–5.6)

## 2023-08-14 LAB — CBC WITH DIFFERENTIAL/PLATELET
Basophils Absolute: 0.1 10*3/uL (ref 0.0–0.2)
Basos: 1 %
EOS (ABSOLUTE): 0.3 10*3/uL (ref 0.0–0.4)
Eos: 5 %
Hematocrit: 38.9 % (ref 37.5–51.0)
Hemoglobin: 13.3 g/dL (ref 13.0–17.7)
Immature Grans (Abs): 0 10*3/uL (ref 0.0–0.1)
Immature Granulocytes: 0 %
Lymphocytes Absolute: 2.7 10*3/uL (ref 0.7–3.1)
Lymphs: 41 %
MCH: 31.6 pg (ref 26.6–33.0)
MCHC: 34.2 g/dL (ref 31.5–35.7)
MCV: 92 fL (ref 79–97)
Monocytes Absolute: 0.7 10*3/uL (ref 0.1–0.9)
Monocytes: 11 %
Neutrophils Absolute: 2.7 10*3/uL (ref 1.4–7.0)
Neutrophils: 42 %
Platelets: 278 10*3/uL (ref 150–450)
RBC: 4.21 x10E6/uL (ref 4.14–5.80)
RDW: 11.8 % (ref 11.6–15.4)
WBC: 6.4 10*3/uL (ref 3.4–10.8)

## 2023-08-14 LAB — LIPID PANEL W/O CHOL/HDL RATIO
Cholesterol, Total: 154 mg/dL (ref 100–199)
HDL: 49 mg/dL (ref >39)
LDL Chol Calc (NIH): 80 mg/dL (ref 0–99)
Triglycerides: 143 mg/dL (ref 0–149)
VLDL Cholesterol Cal: 25 mg/dL (ref 5–40)

## 2023-08-16 LAB — TACROLIMUS LEVEL: Tacrolimus (FK506), Blood: 14.5 ng/mL (ref 2.0–20.0)

## 2023-08-17 NOTE — Progress Notes (Signed)
Patient notified

## 2023-09-03 ENCOUNTER — Other Ambulatory Visit: Payer: Self-pay | Admitting: Internal Medicine

## 2023-09-03 DIAGNOSIS — M810 Age-related osteoporosis without current pathological fracture: Secondary | ICD-10-CM

## 2023-09-03 MED ORDER — ALENDRONATE SODIUM 70 MG PO TABS
70.0000 mg | ORAL_TABLET | ORAL | 4 refills | Status: DC
Start: 2023-09-03 — End: 2024-09-29

## 2023-10-15 ENCOUNTER — Encounter: Payer: Self-pay | Admitting: Internal Medicine

## 2023-10-15 ENCOUNTER — Other Ambulatory Visit: Payer: Self-pay | Admitting: Internal Medicine

## 2023-10-15 DIAGNOSIS — E1165 Type 2 diabetes mellitus with hyperglycemia: Secondary | ICD-10-CM

## 2023-11-05 ENCOUNTER — Other Ambulatory Visit: Payer: Self-pay | Admitting: Family

## 2023-11-28 ENCOUNTER — Other Ambulatory Visit: Payer: Self-pay | Admitting: Internal Medicine

## 2023-12-08 ENCOUNTER — Ambulatory Visit: Payer: 59 | Admitting: Internal Medicine

## 2023-12-09 ENCOUNTER — Other Ambulatory Visit: Payer: Medicare Other

## 2023-12-09 DIAGNOSIS — E782 Mixed hyperlipidemia: Secondary | ICD-10-CM

## 2023-12-09 DIAGNOSIS — E1165 Type 2 diabetes mellitus with hyperglycemia: Secondary | ICD-10-CM

## 2023-12-09 DIAGNOSIS — I10 Essential (primary) hypertension: Secondary | ICD-10-CM

## 2023-12-10 ENCOUNTER — Encounter: Payer: Self-pay | Admitting: Internal Medicine

## 2023-12-10 LAB — CBC WITH DIFF/PLATELET
Basophils Absolute: 0 10*3/uL (ref 0.0–0.2)
Basos: 1 %
EOS (ABSOLUTE): 0.2 10*3/uL (ref 0.0–0.4)
Eos: 3 %
Hematocrit: 38.5 % (ref 37.5–51.0)
Hemoglobin: 13.1 g/dL (ref 13.0–17.7)
Immature Grans (Abs): 0 10*3/uL (ref 0.0–0.1)
Immature Granulocytes: 0 %
Lymphocytes Absolute: 2.8 10*3/uL (ref 0.7–3.1)
Lymphs: 40 %
MCH: 31.9 pg (ref 26.6–33.0)
MCHC: 34 g/dL (ref 31.5–35.7)
MCV: 94 fL (ref 79–97)
Monocytes Absolute: 0.7 10*3/uL (ref 0.1–0.9)
Monocytes: 10 %
Neutrophils Absolute: 3.3 10*3/uL (ref 1.4–7.0)
Neutrophils: 46 %
Platelets: 251 10*3/uL (ref 150–450)
RBC: 4.11 x10E6/uL — ABNORMAL LOW (ref 4.14–5.80)
RDW: 11.9 % (ref 11.6–15.4)
WBC: 7.1 10*3/uL (ref 3.4–10.8)

## 2023-12-10 LAB — CMP14+EGFR
ALT: 19 [IU]/L (ref 0–44)
AST: 23 [IU]/L (ref 0–40)
Albumin: 4 g/dL (ref 3.9–4.9)
Alkaline Phosphatase: 62 [IU]/L (ref 44–121)
BUN/Creatinine Ratio: 9 — ABNORMAL LOW (ref 10–24)
BUN: 12 mg/dL (ref 8–27)
Bilirubin Total: 0.5 mg/dL (ref 0.0–1.2)
CO2: 22 mmol/L (ref 20–29)
Calcium: 9 mg/dL (ref 8.6–10.2)
Chloride: 97 mmol/L (ref 96–106)
Creatinine, Ser: 1.32 mg/dL — ABNORMAL HIGH (ref 0.76–1.27)
Globulin, Total: 2.7 g/dL (ref 1.5–4.5)
Glucose: 129 mg/dL — ABNORMAL HIGH (ref 70–99)
Potassium: 4.1 mmol/L (ref 3.5–5.2)
Sodium: 136 mmol/L (ref 134–144)
Total Protein: 6.7 g/dL (ref 6.0–8.5)
eGFR: 61 mL/min/{1.73_m2} (ref >59)

## 2023-12-10 LAB — LIPID PANEL
Chol/HDL Ratio: 3.2 {ratio} (ref 0.0–5.0)
Cholesterol, Total: 168 mg/dL (ref 100–199)
HDL: 53 mg/dL (ref >39)
LDL Chol Calc (NIH): 83 mg/dL (ref 0–99)
Triglycerides: 192 mg/dL — ABNORMAL HIGH (ref 0–149)
VLDL Cholesterol Cal: 32 mg/dL (ref 5–40)

## 2023-12-10 LAB — HEMOGLOBIN A1C
Est. average glucose Bld gHb Est-mCnc: 160 mg/dL
Hgb A1c MFr Bld: 7.2 % — ABNORMAL HIGH (ref 4.8–5.6)

## 2023-12-15 ENCOUNTER — Encounter: Payer: Self-pay | Admitting: Internal Medicine

## 2023-12-15 ENCOUNTER — Ambulatory Visit (INDEPENDENT_AMBULATORY_CARE_PROVIDER_SITE_OTHER): Payer: 59 | Admitting: Internal Medicine

## 2023-12-15 VITALS — BP 142/80 | HR 76 | Ht 66.0 in | Wt 165.0 lb

## 2023-12-15 DIAGNOSIS — E1169 Type 2 diabetes mellitus with other specified complication: Secondary | ICD-10-CM | POA: Diagnosis not present

## 2023-12-15 DIAGNOSIS — E782 Mixed hyperlipidemia: Secondary | ICD-10-CM

## 2023-12-15 DIAGNOSIS — Z794 Long term (current) use of insulin: Secondary | ICD-10-CM

## 2023-12-15 DIAGNOSIS — E1159 Type 2 diabetes mellitus with other circulatory complications: Secondary | ICD-10-CM | POA: Diagnosis not present

## 2023-12-15 DIAGNOSIS — I152 Hypertension secondary to endocrine disorders: Secondary | ICD-10-CM

## 2023-12-15 DIAGNOSIS — E1165 Type 2 diabetes mellitus with hyperglycemia: Secondary | ICD-10-CM | POA: Diagnosis not present

## 2023-12-15 MED ORDER — INSULIN REGULAR HUMAN 100 UNIT/ML IJ SOLN: INTRAMUSCULAR | 11 refills | Status: DC

## 2023-12-15 NOTE — Progress Notes (Signed)
 Established Patient Office Visit  Subjective:  Patient ID: Terrence Brown, male    DOB: February 26, 1961  Age: 63 y.o. MRN: [id number]  Chief Complaint  Patient presents with   Follow-up    4 month follow up    Patient comes in for a follow-up and to discuss his lab results.  His hemoglobin A1c has gone up to 7.2 from 6.7.  He is concerned but he  ran out of his Humalog insulin -will send in a refill today.  He was also waiting to see the endocrinologist, will send a referral again. 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  Will get records from them.    No other concerns at this time.   Past Medical History:  Diagnosis Date   Cystic fibrosis (HCC)    Diabetes mellitus without complication (HCC)    Osteopenia    Sleep apnea    TIA (transient ischemic attack)     Past Surgical History:  Procedure Laterality Date   COLONOSCOPY N/A 11/09/2015   Procedure: COLONOSCOPY;  Surgeon: Donnice Vaughn Manes, MD;  Location: [facility] ENDOSCOPY;  Service: Endoscopy;  Laterality: N/A;   Per office, patient HAS TO be 1st case - IDDM   knee arthoscopy Right    lung transplant Bilateral     Social History   Socioeconomic History   Marital status: Married    Spouse name: Not on file   Number of children: Not on file   Years of education: Not on file   Highest education level: Not on file  Occupational History   Not on file  Tobacco Use   Smoking status: Never   Smokeless tobacco: Never  Substance and Sexual Activity   Alcohol use: No   Drug use: No   Sexual activity: Yes  Other Topics Concern   Not on file  Social History Narrative   Not on file   Social Drivers of Health   Financial Resource Strain: Not on file  Food Insecurity: Not on file  Transportation Needs: Not on file  Physical Activity: Not on file  Stress: Not on file  Social Connections: Not on file  Intimate Partner Violence: Not on file    History reviewed. No  pertinent family history.  Allergies  Allergen Reactions   Percocet [Oxycodone-Acetaminophen ]    Sulfa Antibiotics     Outpatient Medications Prior to Visit  Medication Sig   alendronate  (FOSAMAX ) 70 MG tablet Take 1 tablet (70 mg total) by mouth once a week. Take with a full glass of water on an empty stomach.   aspirin  EC 81 MG tablet Take 81 mg by mouth daily.   atenolol  (TENORMIN ) 25 MG tablet Take 1 tablet (25 mg total) by mouth daily.   azaTHIOprine  (IMURAN ) 50 MG tablet Take 1 tablet (50 mg total) by mouth daily.   calcium carbonate (TUMS EX) 750 MG chewable tablet Chew 2 tablets by mouth daily.    cyclobenzaprine  (FLEXERIL ) 10 MG tablet Take 1 tablet (10 mg total) by mouth 3 (three) times daily as needed for muscle spasms.   docusate sodium (COLACE) 100 MG capsule Take 100 mg by mouth 2 (two) times daily.   ferrous sulfate  325 (65 FE) MG tablet Take 325 mg by mouth daily with breakfast.   Glucose Blood (ONETOUCH ULTRA BLUE VI) by In Vitro route. 7x/daily PRN   HUMULIN  N 100 UNIT/ML injection Inject 40 Units into the skin 2 (two) times daily before a  meal.   insulin  glargine (LANTUS ) 100 UNIT/ML injection INJECT 10 UNITS IN THE MORNING  AND 10 UNITS IN THE EVENING   Insulin  Pen Needle 31G X 8 MM MISC Use as directed to inject insulin  subcutaneously up to 6 times daily.   Insulin  Syringe-Needle U-100 30G X 5/16 0.3 ML MISC 1 Syringe by Does not apply route every 4 (four) hours.   lovastatin  (MEVACOR ) 40 MG tablet Take 1 tablet (40 mg total) by mouth at bedtime.   magnesium oxide (MAG-OX) 400 MG tablet Take 400 mg by mouth 2 (two) times daily.    Multiple Vitamin (MULTIVITAMIN) capsule Take 1 capsule by mouth daily.   Pancrelipase , Lip-Prot-Amyl, (PANCREAZE ) 4200-14200 units CPEP Take 10-15 capsules by mouth with meals and 0-5 capsules by mouth with snacks.   predniSONE  (DELTASONE ) 5 MG tablet TAKE 3 TABLETS BY MOUTH EVERY  OTHER DAY   tacrolimus  (PROGRAF ) 1 MG capsule Take 3  capsules (3 mg total) by mouth 2 (two) times daily. (Pt stated only send this to [facility] pharmacy)   tretinoin  (RETIN-A ) 0.1 % cream Apply topically at bedtime.   No facility-administered medications prior to visit.    Review of Systems  Constitutional: Negative.  Negative for chills, fever, malaise/fatigue and weight loss.  HENT:  Negative for congestion, ear discharge, hearing loss and sinus pain.   Respiratory: Negative.  Negative for cough and shortness of breath.   Cardiovascular: Negative.  Negative for chest pain, palpitations and leg swelling.  Gastrointestinal: Negative.  Negative for abdominal pain, constipation, diarrhea, heartburn, nausea and vomiting.  Genitourinary: Negative.  Negative for dysuria and flank pain.  Musculoskeletal: Negative.  Negative for joint pain and myalgias.  Skin: Negative.   Neurological: Negative.  Negative for dizziness and headaches.  Endo/Heme/Allergies: Negative.   Psychiatric/Behavioral: Negative.  Negative for depression and suicidal ideas. The patient is not nervous/anxious.        Objective:   BP (!) 142/80   Pulse 76   Ht 5' 6 (1.676 m)   Wt 165 lb (74.8 kg)   SpO2 98%   BMI 26.63 kg/m   Vitals:   12/15/23 1413  BP: (!) 142/80  Pulse: 76  Height: 5' 6 (1.676 m)  Weight: 165 lb (74.8 kg)  SpO2: 98%  BMI (Calculated): 26.64    Physical Exam Vitals and nursing note reviewed.  Constitutional:      Appearance: Normal appearance.  HENT:     Head: Normocephalic and atraumatic.     Nose: Nose normal.     Mouth/Throat:     Mouth: Mucous membranes are moist.     Pharynx: Oropharynx is clear.  Cardiovascular:     Rate and Rhythm: Normal rate and regular rhythm.     Pulses: Normal pulses.     Heart sounds: Normal heart sounds.  Pulmonary:     Effort: Pulmonary effort is normal.     Breath sounds: Normal breath sounds.  Abdominal:     General: Bowel sounds are normal.     Palpations: Abdomen is soft.  Musculoskeletal:         General: Normal range of motion.     Cervical back: Normal range of motion.  Skin:    General: Skin is warm and dry.  Neurological:     General: No focal deficit present.     Mental Status: He is alert and oriented to person, place, and time.  Psychiatric:        Mood and Affect: Mood normal.  Behavior: Behavior normal.        Judgment: Judgment normal.      No results found for any visits on 12/15/23.  Recent Results (from the past 2160 hours)  CMP14+EGFR     Status: Abnormal   Collection Time: 12/09/23  3:36 PM  Result Value Ref Range   Glucose 129 (H) 70 - 99 mg/dL   BUN 12 8 - 27 mg/dL   Creatinine, Ser 8.67 (H) 0.76 - 1.27 mg/dL   eGFR 61 >40 fO/fpw/8.26   BUN/Creatinine Ratio 9 (L) 10 - 24   Sodium 136 134 - 144 mmol/L   Potassium 4.1 3.5 - 5.2 mmol/L   Chloride 97 96 - 106 mmol/L   CO2 22 20 - 29 mmol/L   Calcium 9.0 8.6 - 10.2 mg/dL   Total Protein 6.7 6.0 - 8.5 g/dL   Albumin 4.0 3.9 - 4.9 g/dL   Globulin, Total 2.7 1.5 - 4.5 g/dL   Bilirubin Total 0.5 0.0 - 1.2 mg/dL   Alkaline Phosphatase 62 44 - 121 IU/L   AST 23 0 - 40 IU/L   ALT 19 0 - 44 IU/L  Hemoglobin A1c     Status: Abnormal   Collection Time: 12/09/23  3:37 PM  Result Value Ref Range   Hgb A1c MFr Bld 7.2 (H) 4.8 - 5.6 %    Comment:          Prediabetes: 5.7 - 6.4          Diabetes: >6.4          Glycemic control for adults with diabetes: <7.0    Est. average glucose Bld gHb Est-mCnc 160 mg/dL  Lipid panel     Status: Abnormal   Collection Time: 12/09/23  3:37 PM  Result Value Ref Range   Cholesterol, Total 168 100 - 199 mg/dL   Triglycerides 807 (H) 0 - 149 mg/dL   HDL 53 >60 mg/dL   VLDL Cholesterol Cal 32 5 - 40 mg/dL   LDL Chol Calc (NIH) 83 0 - 99 mg/dL   Chol/HDL Ratio 3.2 0.0 - 5.0 ratio    Comment:                                   T. Chol/HDL Ratio                                             Men  Women                               1/2 Avg.Risk  3.4    3.3                                    Avg.Risk  5.0    4.4                                2X Avg.Risk  9.6    7.1  3X Avg.Risk 23.4   11.0   CBC With Diff/Platelet     Status: Abnormal   Collection Time: 12/09/23  3:37 PM  Result Value Ref Range   WBC 7.1 3.4 - 10.8 x10E3/uL   RBC 4.11 (L) 4.14 - 5.80 x10E6/uL   Hemoglobin 13.1 13.0 - 17.7 g/dL   Hematocrit 61.4 62.4 - 51.0 %   MCV 94 79 - 97 fL   MCH 31.9 26.6 - 33.0 pg   MCHC 34.0 31.5 - 35.7 g/dL   RDW 88.0 88.3 - 84.5 %   Platelets 251 150 - 450 x10E3/uL   Neutrophils 46 Not Estab. %   Lymphs 40 Not Estab. %   Monocytes 10 Not Estab. %   Eos 3 Not Estab. %   Basos 1 Not Estab. %   Neutrophils Absolute 3.3 1.4 - 7.0 x10E3/uL   Lymphocytes Absolute 2.8 0.7 - 3.1 x10E3/uL   Monocytes Absolute 0.7 0.1 - 0.9 x10E3/uL   EOS (ABSOLUTE) 0.2 0.0 - 0.4 x10E3/uL   Basophils Absolute 0.0 0.0 - 0.2 x10E3/uL   Immature Granulocytes 0 Not Estab. %   Immature Grans (Abs) 0.0 0.0 - 0.1 x10E3/uL      Assessment & Plan:  Patient advised to continue all medications.  Referral sent to endocrinology. Problem List Items Addressed This Visit     Cystic fibrosis (HCC)   Type 2 diabetes mellitus with hyperglycemia, with long-term current use of insulin  (HCC) - Primary   Relevant Medications   insulin  regular (HUMULIN  R) 100 units/mL injection   Other Relevant Orders   Ambulatory referral to Endocrinology   Other Visit Diagnoses       Hypertension associated with diabetes (HCC)       Relevant Medications   insulin  regular (HUMULIN  R) 100 units/mL injection     Combined hyperlipidemia associated with type 2 diabetes mellitus (HCC)       Relevant Medications   insulin  regular (HUMULIN  R) 100 units/mL injection       Return in about 4 months (around 04/13/2024).   Total time spent: 30 minutes  FERNAND FREDY RAMAN, MD  12/15/2023   This document may have been prepared by [facility] Voice Recognition software and as such may  include unintentional dictation errors.

## 2024-02-11 ENCOUNTER — Encounter: Payer: Self-pay | Admitting: Endocrinology

## 2024-02-11 ENCOUNTER — Ambulatory Visit (INDEPENDENT_AMBULATORY_CARE_PROVIDER_SITE_OTHER): Payer: Medicare Other | Admitting: Endocrinology

## 2024-02-11 VITALS — BP 126/80 | HR 65 | Resp 20 | Ht 66.0 in | Wt 162.8 lb

## 2024-02-11 DIAGNOSIS — E1069 Type 1 diabetes mellitus with other specified complication: Secondary | ICD-10-CM

## 2024-02-11 MED ORDER — DEXCOM G7 SENSOR MISC: 1.0000 | 3 refills | Status: DC

## 2024-02-11 NOTE — Progress Notes (Signed)
 Outpatient Endocrinology Note Terrence Rosilyn Coachman, MD   Patient's Name: Terrence Brown    DOB: 12-Jul-1961    MRN: [id number]                                                    REASON OF VISIT: New consult for type 1 diabetes mellitus  REFERRING PROVIDER: Margaretann Loveless, MD  PCP: Margaretann Loveless, MD  HISTORY OF PRESENT ILLNESS:   Terrence Brown is a 63 y.o. old male with past medical history listed below, is here for new consult for type 1 diabetes mellitus.   Pertinent Diabetes History: Patient was diagnosed with type 1 diabetes mellitus in 1979 at the age of 14 years.  In the remote past he was following with endocrinology at [facility], lately was following with primary care provider for the diabetes care.  Patient is referred to establish endocrinology care for type 1 diabetes mellitus.  He has fair control of type 1 diabetes mellitus with hemoglobin A1c mostly in the range of 7s percent.  Hemoglobin A1c in January was 7.2%.  Patient has cystic fibrosis, status post bilateral lung transplant in 2003.  He is on prednisone, tacrolimus /immunosuppressive therapy.  Chronic Diabetes Complications : Retinopathy: no. Last ophthalmology exam was done on annually, following with ophthalmology regularly.  No records available to review. Nephropathy: CKD Peripheral neuropathy: no Coronary artery disease: no Stroke: TIA  Relevant comorbidities and cardiovascular risk factors: Obesity: no Body mass index is 26.28 kg/m.  Hypertension: no Hyperlipidemia : Yes, on statin   Current / Home Diabetic regimen includes:  Patient reports he has been taking basal insulin Lantus and Humulin N with R for several years.  Lantus 15 units two times a day. Humulin N/R : 30+15 units AM and 15+10 PM.  He uses insulin syringe. Humulin R 5-10 units sliding scale as needed, not often.  Uses rarely.  Prior diabetic medications:  Glycemic data:   He forgot to bring glucometer in the clinic today.  Not able  to review glucose data.  He has One Touch glucometer and checks blood sugar up to 7 times a day.  In last 2 weeks he reports his highest blood sugar was 298 and lowest blood sugar is 83.  Denies hypoglycemia.  Hypoglycemia: Patient has denies hypoglycemic episodes. Patient has hypoglycemia awareness.  Factors modifying glucose control: 1.  Diabetic diet assessment: 3 meals a day.  He eats high-calorie diet.  He has cystic fibrosis.  2.  Staying active or exercising:   3.  Medication compliance: compliant all of the time.  # Patient has osteopenia, on longstanding use of alendronate/Fosamax, managed by primary care provider.  Interval history  Patient presented to establish diabetes care.  He has no new vision problem.  He reports following regularly with ophthalmology.  No numbness and ting of the feet.  No other complaints today.  He is accompanied by his wife in the clinic today.  REVIEW OF SYSTEMS As per history of present illness.   PAST MEDICAL HISTORY: Past Medical History:  Diagnosis Date   Cystic fibrosis (HCC)    Diabetes mellitus without complication (HCC)    Osteopenia    Sleep apnea    TIA (transient ischemic attack)     PAST SURGICAL HISTORY: Past Surgical History:  Procedure Laterality Date   COLONOSCOPY  N/A 11/09/2015   Procedure: COLONOSCOPY;  Surgeon: Elnita Maxwell, MD;  Location: [facility] ENDOSCOPY;  Service: Endoscopy;  Laterality: N/A;   Per office, patient HAS TO be 1st case - IDDM   knee arthoscopy Right    lung transplant Bilateral     ALLERGIES: Allergies  Allergen Reactions   Percocet [Oxycodone-Acetaminophen]    Sulfa Antibiotics     FAMILY HISTORY:  History reviewed. No pertinent family history.  SOCIAL HISTORY: Social History   Socioeconomic History   Marital status: Married    Spouse name: Not on file   Number of children: Not on file   Years of education: Not on file   Highest education level: Not on file  Occupational History    Not on file  Tobacco Use   Smoking status: Never   Smokeless tobacco: Never  Substance and Sexual Activity   Alcohol use: No   Drug use: No   Sexual activity: Yes  Other Topics Concern   Not on file  Social History Narrative   Not on file   Social Drivers of Health   Financial Resource Strain: Not on file  Food Insecurity: Not on file  Transportation Needs: Not on file  Physical Activity: Not on file  Stress: Not on file  Social Connections: Not on file    MEDICATIONS:  Current Outpatient Medications  Medication Sig Dispense Refill   alendronate (FOSAMAX) 70 MG tablet Take 1 tablet (70 mg total) by mouth once a week. Take with a full glass of water on an empty stomach. 12 tablet 4   aspirin EC 81 MG tablet Take 81 mg by mouth daily.     atenolol (TENORMIN) 25 MG tablet Take 1 tablet (25 mg total) by mouth daily. 90 tablet 3   azaTHIOprine (IMURAN) 50 MG tablet Take 1 tablet (50 mg total) by mouth daily. 90 tablet 3   calcium carbonate (TUMS EX) 750 MG chewable tablet Chew 2 tablets by mouth daily.      Continuous Glucose Sensor (DEXCOM G7 SENSOR) MISC 1 Device by Does not apply route continuous. Change every 10 days. 9 each 3   cyclobenzaprine (FLEXERIL) 10 MG tablet Take 1 tablet (10 mg total) by mouth 3 (three) times daily as needed for muscle spasms. 90 tablet 1   docusate sodium (COLACE) 100 MG capsule Take 100 mg by mouth 2 (two) times daily.     ferrous sulfate 325 (65 FE) MG tablet Take 325 mg by mouth daily with breakfast.     Glucose Blood (ONETOUCH ULTRA BLUE VI) by In Vitro route. 7x/daily PRN     HUMULIN N 100 UNIT/ML injection Inject 40 Units into the skin 2 (two) times daily before a meal.     insulin glargine (LANTUS) 100 UNIT/ML injection INJECT 10 UNITS IN THE MORNING  AND 10 UNITS IN THE EVENING 40 mL 3   Insulin Pen Needle 31G X 8 MM MISC Use as directed to inject insulin subcutaneously up to 6 times daily. 600 each 3   insulin regular (HUMULIN R) 100  units/mL injection Use with sliding scale twice a day 10 mL 11   Insulin Syringe-Needle U-100 30G X 5/16" 0.3 ML MISC 1 Syringe by Does not apply route every 4 (four) hours. 540 each 3   lovastatin (MEVACOR) 40 MG tablet Take 1 tablet (40 mg total) by mouth at bedtime. 90 tablet 3   magnesium oxide (MAG-OX) 400 MG tablet Take 400 mg by mouth 2 (two) times daily.  Multiple Vitamin (MULTIVITAMIN) capsule Take 1 capsule by mouth daily.     Pancrelipase, Lip-Prot-Amyl, (PANCREAZE) 4200-14200 units CPEP Take 10-15 capsules by mouth with meals and 0-5 capsules by mouth with snacks. 4500 capsule 3   predniSONE (DELTASONE) 5 MG tablet TAKE 3 TABLETS BY MOUTH EVERY  OTHER DAY 405 tablet 3   tacrolimus (PROGRAF) 1 MG capsule Take 3 capsules (3 mg total) by mouth 2 (two) times daily. (Pt stated only send this to [facility] pharmacy) 180 capsule 6   tretinoin (RETIN-A) 0.1 % cream Apply topically at bedtime. 45 g 6   No current facility-administered medications for this visit.    PHYSICAL EXAM: Vitals:   02/11/24 1436  BP: 126/80  Pulse: 65  Resp: 20  SpO2: 98%  Weight: 162 lb 12.8 oz (73.8 kg)  Height: 5\' 6"  (1.676 m)   Body mass index is 26.28 kg/m.  Wt Readings from Last 3 Encounters:  02/11/24 162 lb 12.8 oz (73.8 kg)  12/15/23 165 lb (74.8 kg)  08/04/23 163 lb (73.9 kg)    General: Well developed, well nourished male in no apparent distress.  HEENT: AT/[place], no external lesions.  Eyes: Conjunctiva clear and no icterus. Neck: Neck supple  Lungs: Respirations not labored Neurologic: Alert, oriented, normal speech Extremities / Skin: Dry.  Psychiatric: Does not appear depressed or anxious   Diabetic Foot Exam - Simple   No data filed   LABS Reviewed Lab Results  Component Value Date   HGBA1C 7.2 (H) 12/09/2023   HGBA1C 6.7 (H) 08/13/2023   HGBA1C 7.0 (H) 05/12/2023   No results found for: "FRUCTOSAMINE" Lab Results  Component Value Date   CHOL 168 12/09/2023   HDL 53  12/09/2023   LDLCALC 83 12/09/2023   TRIG 192 (H) 12/09/2023   CHOLHDL 3.2 12/09/2023   No results found for: "MICRALBCREAT" Lab Results  Component Value Date   CREATININE 1.32 (H) 12/09/2023   No results found for: "GFR"  ASSESSMENT / PLAN  1. Type 1 diabetes mellitus with other specified complication (HCC)     Diabetes Mellitus type 1, complicated by CKD - Diabetic status / severity: Fair control.  Lab Results  Component Value Date   HGBA1C 7.2 (H) 12/09/2023    - Hemoglobin A1c goal : <7%  Patient was diagnosed with type 1 diabetes mellitus at the age of 14 years.  He has cystic fibrosis status post lung transplant and on immunosuppressive therapy.  He has been on multidose insulin regimen including basal insulin and Humulin NPH and R.  He reports he has been on this regimen insulin for several years.  He eats high-calorie diet does not restrict calories because of cystic fibrosis.  No glucometer data to review.  Denies hypoglycemia.  - Medications: See below.  No change.  I) continue Lantus 15 units 2 times a day. II) continue Humulin N/R : 30+15 units AM and 15+10 PM.  He uses insulin syringe. III) continue Humulin R sliding scale as needed.  Reports he has insulins at home and does not require prescription today.  - Home glucose testing: Sent prescription for Dexcom G7.  Check blood sugar before meals and bedtime.  Patient is asked to bring glucometer in the follow-up visit.  Will check with pharmacy regarding coverage for Dexcom G7. - Discussed/ Gave Hypoglycemia treatment plan.  # Consult : not required at this time.   # Annual urine for microalbuminuria/ creatinine ratio, no microalbuminuria currently.  Will check in the future visit.  Patient reports it is being monitored by primary care provider and used to be normal. Last No results found for: "MICRALBCREAT"  # Foot check nightly.  # Annual dilated diabetic eye exams.    2. Blood pressure  -  BP  Readings from Last 1 Encounters:  02/11/24 126/80    - Control is in target.  - No change in current plans.  3. Lipid status / Hyperlipidemia - Last  Lab Results  Component Value Date   LDLCALC 83 12/09/2023   - Continue lovastatin 40 mg daily.  Managed by PCP.  Diagnoses and all orders for this visit:  Type 1 diabetes mellitus with other specified complication (HCC) -     Continuous Glucose Sensor (DEXCOM G7 SENSOR) MISC; 1 Device by Does not apply route continuous. Change every 10 days.    DISPOSITION Follow up in clinic in 3 months suggested.   All questions answered and patient verbalized understanding of the plan.  Terrence Mindy Behnken, MD [facility] Endocrinology [facility] [address] [place], [address] [place], [place] [address] Phone # [phone]  At least part of this note was generated using voice recognition software. Inadvertent word errors may have occurred, which were not recognized during the proofreading process.

## 2024-02-16 ENCOUNTER — Telehealth: Payer: Self-pay | Admitting: Dietician

## 2024-02-16 NOTE — Telephone Encounter (Signed)
 Returned patient call as he left a message on our phone.  He states that he has a prescription for Dexcom at [facility] but states that they need the prescription "prescribed in a different way" and need a prior authorization.  Will send to [place] [facility].  Oran Rein, RD, LDN, CDCES, DipACLM

## 2024-02-25 ENCOUNTER — Other Ambulatory Visit (HOSPITAL_COMMUNITY): Payer: Self-pay

## 2024-02-25 ENCOUNTER — Telehealth: Payer: Self-pay

## 2024-02-25 NOTE — Telephone Encounter (Signed)
 Pharmacy Patient Advocate Encounter   Received notification from Pt Calls Messages that prior authorization for Dexcom G7 sensor is required/requested.   Insurance verification completed.   The patient is insured through [facility] .   Per test claim: PA required; PA submitted to above mentioned insurance via CoverMyMeds Key/confirmation #/EOC [id number] Status is pending

## 2024-02-26 NOTE — Telephone Encounter (Signed)
 Pharmacy Patient Advocate Encounter  Received notification from [facility] that Prior Authorization for Dexcom G7 sensor has been APPROVED through 02/24/25   PA #/Case ID/Reference #: [id number]

## 2024-03-02 NOTE — Telephone Encounter (Signed)
 PA request has been Approved. New Encounter has been or will be created for follow up. For additional info see Pharmacy Prior Auth telephone encounter from 02/25/2024. PT and clinic aware

## 2024-03-08 ENCOUNTER — Encounter: Payer: Self-pay | Admitting: Internal Medicine

## 2024-03-08 ENCOUNTER — Other Ambulatory Visit: Payer: Self-pay

## 2024-03-08 DIAGNOSIS — E1069 Type 1 diabetes mellitus with other specified complication: Secondary | ICD-10-CM

## 2024-03-08 MED ORDER — DEXCOM G7 SENSOR MISC
1.0000 | 3 refills | Status: DC
Start: 2024-03-08 — End: 2024-05-19

## 2024-03-08 NOTE — Telephone Encounter (Signed)
 Pt had called and lvm stated that he needed to have his rx for Dexcom switch to [facility] b/c [facility] is out of stock . Left detail vm to advise patient that this has sent to [facility] .

## 2024-04-19 ENCOUNTER — Ambulatory Visit: Payer: Self-pay | Admitting: Internal Medicine

## 2024-04-19 ENCOUNTER — Encounter: Payer: Self-pay | Admitting: Internal Medicine

## 2024-04-19 ENCOUNTER — Ambulatory Visit (INDEPENDENT_AMBULATORY_CARE_PROVIDER_SITE_OTHER): Payer: 59 | Admitting: Internal Medicine

## 2024-04-19 VITALS — BP 130/74 | HR 73 | Ht 66.0 in | Wt 164.0 lb

## 2024-04-19 DIAGNOSIS — L989 Disorder of the skin and subcutaneous tissue, unspecified: Secondary | ICD-10-CM

## 2024-04-19 DIAGNOSIS — E1021 Type 1 diabetes mellitus with diabetic nephropathy: Secondary | ICD-10-CM | POA: Diagnosis not present

## 2024-04-19 DIAGNOSIS — I10 Essential (primary) hypertension: Secondary | ICD-10-CM | POA: Diagnosis not present

## 2024-04-19 DIAGNOSIS — E782 Mixed hyperlipidemia: Secondary | ICD-10-CM

## 2024-04-19 DIAGNOSIS — Z942 Lung transplant status: Secondary | ICD-10-CM

## 2024-04-19 DIAGNOSIS — K219 Gastro-esophageal reflux disease without esophagitis: Secondary | ICD-10-CM | POA: Diagnosis not present

## 2024-04-19 LAB — POCT CBG (FASTING - GLUCOSE)-MANUAL ENTRY: Glucose Fasting, POC: 74 mg/dL (ref 70–99)

## 2024-04-19 MED ORDER — FAMOTIDINE 20 MG PO TABS: 20.0000 mg | ORAL_TABLET | Freq: Every day | ORAL | 1 refills | Status: AC

## 2024-04-19 MED ORDER — GLUCAGON EMERGENCY 1 MG IJ KIT: PACK | INTRAMUSCULAR | 6 refills | Status: AC

## 2024-04-19 NOTE — Progress Notes (Signed)
 Established Patient Office Visit  Subjective:  Patient ID: Terrence Brown, male    DOB: 1961/01/01  Age: 63 y.o. MRN: [id number]  Chief Complaint  Patient presents with   Follow-up    4 month follow up    Patient comes in for follow-up accompanied by his wife.  He was seen by the endocrinologist and is currently monitoring his blood sugar readings.  His insulin  regimen has not been changed yet.  He will be getting blood work today what his current hemoglobin A1c is years.  Recently he has been feeling little unsteady on his feet and thinks because his blood sugar drops.  Although he does not get symptomatic during his hypoglycemic episodes but his Dexcom sets of alarm.  He is able to eat something sweet bring it up.  Will send in a new prescription for emergency glucagon kit. Patient also mentions intermittent heartburn, used to be on H2 blockers but his prescription ran out, will send in a new prescription for Pepcid. Denies chest pain, no chest tightness.  Does have arthritic aches and pains.  He notes a small lesion on top of his right eyebrow.  Will send a dermatology referral.    No other concerns at this time.   Past Medical History:  Diagnosis Date   Cystic fibrosis (HCC)    Diabetes mellitus without complication (HCC)    Osteopenia    Sleep apnea    TIA (transient ischemic attack)     Past Surgical History:  Procedure Laterality Date   COLONOSCOPY N/A 11/09/2015   Procedure: COLONOSCOPY;  Surgeon: Luella Sager, MD;  Location: [facility] ENDOSCOPY;  Service: Endoscopy;  Laterality: N/A;   Per office, patient HAS TO be 1st case - IDDM   knee arthoscopy Right    lung transplant Bilateral     Social History   Socioeconomic History   Marital status: Married    Spouse name: Not on file   Number of children: Not on file   Years of education: Not on file   Highest education level: Not on file  Occupational History   Not on file  Tobacco Use   Smoking status:  Never   Smokeless tobacco: Never  Substance and Sexual Activity   Alcohol use: No   Drug use: No   Sexual activity: Yes  Other Topics Concern   Not on file  Social History Narrative   Not on file   Social Drivers of Health   Financial Resource Strain: Not on file  Food Insecurity: Not on file  Transportation Needs: Not on file  Physical Activity: Not on file  Stress: Not on file  Social Connections: Not on file  Intimate Partner Violence: Not on file    History reviewed. No pertinent family history.  Allergies  Allergen Reactions   Percocet [Oxycodone-Acetaminophen ]    Sulfa Antibiotics     Outpatient Medications Prior to Visit  Medication Sig   alendronate  (FOSAMAX ) 70 MG tablet Take 1 tablet (70 mg total) by mouth once a week. Take with a full glass of water on an empty stomach.   aspirin  EC 81 MG tablet Take 81 mg by mouth daily.   atenolol  (TENORMIN ) 25 MG tablet Take 1 tablet (25 mg total) by mouth daily.   azaTHIOprine  (IMURAN ) 50 MG tablet Take 1 tablet (50 mg total) by mouth daily.   calcium carbonate (TUMS EX) 750 MG chewable tablet Chew 2 tablets by mouth daily.    Continuous Glucose Sensor (DEXCOM G7  SENSOR) MISC 1 Device by Does not apply route continuous. Change every 10 days.   cyclobenzaprine  (FLEXERIL ) 10 MG tablet Take 1 tablet (10 mg total) by mouth 3 (three) times daily as needed for muscle spasms.   docusate sodium (COLACE) 100 MG capsule Take 100 mg by mouth 2 (two) times daily.   ferrous sulfate  325 (65 FE) MG tablet Take 325 mg by mouth daily with breakfast.   Glucose Blood (ONETOUCH ULTRA BLUE VI) by In Vitro route. 7x/daily PRN   HUMULIN N 100 UNIT/ML injection Inject 40 Units into the skin 2 (two) times daily before a meal.   insulin  glargine (LANTUS ) 100 UNIT/ML injection INJECT 10 UNITS IN THE MORNING  AND 10 UNITS IN THE EVENING   Insulin  Pen Needle 31G X 8 MM MISC Use as directed to inject insulin  subcutaneously up to 6 times daily.   insulin   regular (HUMULIN R ) 100 units/mL injection Use with sliding scale twice a day   Insulin  Syringe-Needle U-100 30G X 5/16" 0.3 ML MISC 1 Syringe by Does not apply route every 4 (four) hours.   lovastatin  (MEVACOR ) 40 MG tablet Take 1 tablet (40 mg total) by mouth at bedtime.   magnesium oxide (MAG-OX) 400 MG tablet Take 400 mg by mouth 2 (two) times daily.    Multiple Vitamin (MULTIVITAMIN) capsule Take 1 capsule by mouth daily.   Pancrelipase , Lip-Prot-Amyl, (PANCREAZE ) 4200-14200 units CPEP Take 10-15 capsules by mouth with meals and 0-5 capsules by mouth with snacks.   predniSONE  (DELTASONE ) 5 MG tablet TAKE 3 TABLETS BY MOUTH EVERY  OTHER DAY   tacrolimus  (PROGRAF ) 1 MG capsule Take 3 capsules (3 mg total) by mouth 2 (two) times daily. (Pt stated only send this to [facility] pharmacy)   tretinoin  (RETIN-A ) 0.1 % cream Apply topically at bedtime.   No facility-administered medications prior to visit.    Review of Systems  Constitutional:  Positive for malaise/fatigue. Negative for chills, fever and weight loss.  HENT: Negative.  Negative for ear pain and sore throat.   Eyes: Negative.   Respiratory:  Negative for cough, sputum production, shortness of breath and wheezing.   Cardiovascular: Negative.  Negative for chest pain, palpitations and leg swelling.  Gastrointestinal: Negative.  Negative for abdominal pain, constipation, diarrhea, heartburn, nausea and vomiting.  Genitourinary: Negative.  Negative for dysuria and flank pain.  Musculoskeletal: Negative.  Negative for joint pain and myalgias.  Skin: Negative.   Neurological: Negative.  Negative for dizziness and headaches.  Endo/Heme/Allergies: Negative.   Psychiatric/Behavioral: Negative.  Negative for depression and suicidal ideas. The patient is not nervous/anxious.        Objective:   BP 130/74   Pulse 73   Ht 5\' 6"  (1.676 m)   Wt 164 lb (74.4 kg)   SpO2 97%   BMI 26.47 kg/m   Vitals:   04/19/24 1434  BP: 130/74   Pulse: 73  Height: 5\' 6"  (1.676 m)  Weight: 164 lb (74.4 kg)  SpO2: 97%  BMI (Calculated): 26.48    Physical Exam Vitals and nursing note reviewed.  Constitutional:      Appearance: Normal appearance.  HENT:     Head: Normocephalic and atraumatic.     Nose: Nose normal.     Mouth/Throat:     Mouth: Mucous membranes are moist.     Pharynx: Oropharynx is clear.  Eyes:     Conjunctiva/sclera: Conjunctivae normal.     Pupils: Pupils are equal, round, and reactive to light.  Cardiovascular:     Rate and Rhythm: Normal rate and regular rhythm.     Pulses: Normal pulses.     Heart sounds: Normal heart sounds.  Pulmonary:     Effort: Pulmonary effort is normal.     Breath sounds: Normal breath sounds.  Abdominal:     General: Bowel sounds are normal.     Palpations: Abdomen is soft.  Musculoskeletal:        General: Normal range of motion.     Cervical back: Normal range of motion.  Skin:    General: Skin is warm and dry.  Neurological:     General: No focal deficit present.     Mental Status: He is alert and oriented to person, place, and time.  Psychiatric:        Mood and Affect: Mood normal.        Behavior: Behavior normal.        Judgment: Judgment normal.      Results for orders placed or performed in visit on 04/19/24  POCT CBG (Fasting - Glucose)  Result Value Ref Range   Glucose Fasting, POC 74 70 - 99 mg/dL    Recent Results (from the past 2160 hours)  POCT CBG (Fasting - Glucose)     Status: Normal   Collection Time: 04/19/24  2:37 PM  Result Value Ref Range   Glucose Fasting, POC 74 70 - 99 mg/dL      Assessment & Plan:  Check blood work today.  Prescription sent for glucagon kit and Pepcid.  Dermatology referral for the lesion on top of his right eyebrow. Problem List Items Addressed This Visit     S/P lung transplant (HCC)   Relevant Orders   Tacrolimus  Level   Cystic fibrosis (HCC)   Hyperlipidemia   Relevant Orders   Lipid panel   Type  2 diabetes mellitus with hyperglycemia, with long-term current use of insulin  (HCC) - Primary   Relevant Medications   Glucagon, rDNA, (GLUCAGON EMERGENCY) 1 MG KIT   Other Visit Diagnoses       Gastroesophageal reflux disease without esophagitis       Relevant Medications   famotidine (PEPCID) 20 MG tablet     Skin lesion of face       Relevant Orders   Ambulatory referral to Dermatology     Essential hypertension, benign       Relevant Orders   CMP14+EGFR   Lipid panel   CBC With Diff/Platelet   Hemoglobin A1c       Return in about 6 months (around 10/20/2024).   Total time spent: 30 minutes  Aisha Hove, MD  04/19/2024   This document may have been prepared by [facility] Voice Recognition software and as such may include unintentional dictation errors.

## 2024-04-21 LAB — CMP14+EGFR
ALT: 17 IU/L (ref 0–44)
AST: 19 IU/L (ref 0–40)
Albumin: 3.9 g/dL (ref 3.9–4.9)
Alkaline Phosphatase: 59 IU/L (ref 44–121)
BUN/Creatinine Ratio: 9 — ABNORMAL LOW (ref 10–24)
BUN: 11 mg/dL (ref 8–27)
Bilirubin Total: 0.4 mg/dL (ref 0.0–1.2)
CO2: 19 mmol/L — ABNORMAL LOW (ref 20–29)
Calcium: 8.8 mg/dL (ref 8.6–10.2)
Chloride: 98 mmol/L (ref 96–106)
Creatinine, Ser: 1.25 mg/dL (ref 0.76–1.27)
Globulin, Total: 2.4 g/dL (ref 1.5–4.5)
Glucose: 82 mg/dL (ref 70–99)
Potassium: 4.1 mmol/L (ref 3.5–5.2)
Sodium: 134 mmol/L (ref 134–144)
Total Protein: 6.3 g/dL (ref 6.0–8.5)
eGFR: 66 mL/min/{1.73_m2} (ref >59)

## 2024-04-21 LAB — TACROLIMUS LEVEL: Tacrolimus (FK506), Blood: 9.7 ng/mL (ref 5.0–20.0)

## 2024-04-22 ENCOUNTER — Telehealth: Payer: Self-pay

## 2024-04-22 NOTE — Telephone Encounter (Signed)
 error

## 2024-05-17 ENCOUNTER — Ambulatory Visit: Payer: Self-pay | Admitting: Endocrinology

## 2024-05-17 ENCOUNTER — Other Ambulatory Visit: Payer: Self-pay

## 2024-05-17 ENCOUNTER — Ambulatory Visit (INDEPENDENT_AMBULATORY_CARE_PROVIDER_SITE_OTHER): Admitting: Endocrinology

## 2024-05-17 ENCOUNTER — Encounter: Payer: Self-pay | Admitting: Endocrinology

## 2024-05-17 VITALS — BP 124/80 | HR 66 | Resp 16 | Ht 66.0 in | Wt 159.8 lb

## 2024-05-17 DIAGNOSIS — E108 Type 1 diabetes mellitus with unspecified complications: Secondary | ICD-10-CM

## 2024-05-17 DIAGNOSIS — I158 Other secondary hypertension: Secondary | ICD-10-CM

## 2024-05-17 DIAGNOSIS — E785 Hyperlipidemia, unspecified: Secondary | ICD-10-CM

## 2024-05-17 DIAGNOSIS — E1069 Type 1 diabetes mellitus with other specified complication: Secondary | ICD-10-CM | POA: Diagnosis not present

## 2024-05-17 DIAGNOSIS — Z942 Lung transplant status: Secondary | ICD-10-CM

## 2024-05-17 LAB — POCT GLYCOSYLATED HEMOGLOBIN (HGB A1C): Hemoglobin A1C: 7 % — AB (ref 4.0–5.6)

## 2024-05-17 MED ORDER — INSULIN GLARGINE 100 UNIT/ML ~~LOC~~ SOLN: SUBCUTANEOUS | 3 refills | Status: AC

## 2024-05-17 MED ORDER — HUMULIN N 100 UNIT/ML ~~LOC~~ SUSP: 20.0000 [IU] | Freq: Two times a day (BID) | SUBCUTANEOUS | 4 refills | Status: AC

## 2024-05-17 MED ORDER — INSULIN REGULAR HUMAN 100 UNIT/ML IJ SOLN: INTRAMUSCULAR | 4 refills | Status: DC

## 2024-05-17 NOTE — Progress Notes (Signed)
 Outpatient Endocrinology Note [place] Maisie Hauser, MD   Patient's Name: Terrence Brown    DOB: 1961-01-30    MRN: [id number]                                                    REASON OF VISIT: Follow-up for type 1 diabetes mellitus  REFERRING PROVIDER: Aisha Hove, MD  PCP: Aisha Hove, MD  HISTORY OF PRESENT ILLNESS:   RESHARD GUILLET is a 63 y.o. old male with past medical history listed below, is here for follow-up for type 1 diabetes mellitus.   Pertinent Diabetes History: Patient was diagnosed with type 1 diabetes mellitus in 1979 at the age of 14 years.  In the remote past he was following with endocrinology at [facility], lately was following with primary care provider for the diabetes care.  Patient was referred to establish endocrinology care for type 1 diabetes mellitus, initial consult in March 2025.  He has fair control of type 1 diabetes mellitus with hemoglobin A1c mostly in the range of 7s percent.    Patient has cystic fibrosis, status post bilateral lung transplant in 2003.  He is on prednisone , tacrolimus  /immunosuppressive therapy.  Chronic Diabetes Complications : Retinopathy: no. Last ophthalmology exam was done on annually, following with ophthalmology regularly.  No records available to review. Nephropathy: CKD Peripheral neuropathy: no Coronary artery disease: no Stroke: TIA  Relevant comorbidities and cardiovascular risk factors: Obesity: no Body mass index is 25.79 kg/m.  Hypertension: no Hyperlipidemia : Yes, on statin   Current / Home Diabetic regimen includes:  Patient reports he has been taking basal insulin  Lantus  and Humulin N with R for several years.  Lantus  15 units two times a day. Humulin N 0-20 two times a day. Humulin R  0-20 four times a day.  He adjust Humulin N and R based on meal size and blood sugar at that time.  He has been injecting insulin  into thighs, does not like to use abdomen.  Prior diabetic medications:  Glycemic  data:    CONTINUOUS GLUCOSE MONITORING SYSTEM (CGMS) INTERPRETATION:             Dexcom G7 CGM-  Sensor Download (Sensor download was reviewed and summarized below.) Dates: June 4 to May 17, 2024, 14 days  Glucose Management Indicator: 8.5% Sensor usage:96 %    Impression: - Variable blood sugar, postprandial hyperglycemia with blood sugar up to 250-300 range, sometimes staying for prolonged hours hyperglycemia.  Trending down blood sugar with occasional mild hypoglycemia in between the meals.  Revealed significant hyperglycemia with blood sugar up to 350 range.  Some of the days acceptable blood sugar with mild hyperglycemia and acceptable blood sugar in between the meals and overnight.  Hypoglycemia: Patient has minor hypoglycemic episodes. Patient has hypoglycemia awareness.  Factors modifying glucose control: 1.  Diabetic diet assessment: 3 meals a day.  He eats high-calorie diet.  He has cystic fibrosis.  2.  Staying active or exercising:   3.  Medication compliance: compliant all of the time.  # Patient has osteopenia, on longstanding use of alendronate /Fosamax , managed by primary care provider.  Interval history  He has been on Dexcom since last visit.  He is liking it to monitor blood sugar however he has occasional false alarm for low blood sugar he double checked with the  glucometer and not actually hypoglycemia.  CGM data as reviewed above.  Variable blood sugar with postprandial hyperglycemia.  Hemoglobin A1c 7%.  GMI on CGM 8.5%.  No other complaints today. He is accompanied by his wife in the clinic today.  REVIEW OF SYSTEMS As per history of present illness.   PAST MEDICAL HISTORY: Past Medical History:  Diagnosis Date   Cystic fibrosis (HCC)    Diabetes mellitus without complication (HCC)    Osteopenia    Sleep apnea    TIA (transient ischemic attack)     PAST SURGICAL HISTORY: Past Surgical History:  Procedure Laterality Date   COLONOSCOPY N/A  11/09/2015   Procedure: COLONOSCOPY;  Surgeon: Luella Sager, MD;  Location: [facility] ENDOSCOPY;  Service: Endoscopy;  Laterality: N/A;   Per office, patient HAS TO be 1st case - IDDM   knee arthoscopy Right    lung transplant Bilateral     ALLERGIES: Allergies  Allergen Reactions   Percocet [Oxycodone-Acetaminophen ]    Sulfa Antibiotics     FAMILY HISTORY:  History reviewed. No pertinent family history.  SOCIAL HISTORY: Social History   Socioeconomic History   Marital status: Married    Spouse name: Not on file   Number of children: Not on file   Years of education: Not on file   Highest education level: Not on file  Occupational History   Not on file  Tobacco Use   Smoking status: Never   Smokeless tobacco: Never  Substance and Sexual Activity   Alcohol use: No   Drug use: No   Sexual activity: Yes  Other Topics Concern   Not on file  Social History Narrative   Not on file   Social Drivers of Health   Financial Resource Strain: Not on file  Food Insecurity: Not on file  Transportation Needs: Not on file  Physical Activity: Not on file  Stress: Not on file  Social Connections: Not on file    MEDICATIONS:  Current Outpatient Medications  Medication Sig Dispense Refill   alendronate  (FOSAMAX ) 70 MG tablet Take 1 tablet (70 mg total) by mouth once a week. Take with a full glass of water on an empty stomach. 12 tablet 4   aspirin  EC 81 MG tablet Take 81 mg by mouth daily.     atenolol  (TENORMIN ) 25 MG tablet Take 1 tablet (25 mg total) by mouth daily. 90 tablet 3   azaTHIOprine  (IMURAN ) 50 MG tablet Take 1 tablet (50 mg total) by mouth daily. 90 tablet 3   calcium carbonate (TUMS EX) 750 MG chewable tablet Chew 2 tablets by mouth daily.      Continuous Glucose Sensor (DEXCOM G7 SENSOR) MISC 1 Device by Does not apply route continuous. Change every 10 days. 9 each 3   cyclobenzaprine  (FLEXERIL ) 10 MG tablet Take 1 tablet (10 mg total) by mouth 3 (three) times  daily as needed for muscle spasms. 90 tablet 1   docusate sodium (COLACE) 100 MG capsule Take 100 mg by mouth 2 (two) times daily.     famotidine  (PEPCID ) 20 MG tablet Take 1 tablet (20 mg total) by mouth daily. 90 tablet 1   ferrous sulfate  325 (65 FE) MG tablet Take 325 mg by mouth daily with breakfast.     Glucagon , rDNA, (GLUCAGON  EMERGENCY) 1 MG KIT To use as needed prn hypoglycemia 1 kit 6   Glucose Blood (ONETOUCH ULTRA BLUE VI) by In Vitro route. 7x/daily PRN     Insulin  Pen Needle 31G  X 8 MM MISC Use as directed to inject insulin  subcutaneously up to 6 times daily. 600 each 3   Insulin  Syringe-Needle U-100 30G X 5/16 0.3 ML MISC 1 Syringe by Does not apply route every 4 (four) hours. 540 each 3   lovastatin  (MEVACOR ) 40 MG tablet Take 1 tablet (40 mg total) by mouth at bedtime. 90 tablet 3   magnesium oxide (MAG-OX) 400 MG tablet Take 400 mg by mouth 2 (two) times daily.      Multiple Vitamin (MULTIVITAMIN) capsule Take 1 capsule by mouth daily.     Pancrelipase , Lip-Prot-Amyl, (PANCREAZE ) 4200-14200 units CPEP Take 10-15 capsules by mouth with meals and 0-5 capsules by mouth with snacks. 4500 capsule 3   predniSONE  (DELTASONE ) 5 MG tablet TAKE 3 TABLETS BY MOUTH EVERY  OTHER DAY 405 tablet 3   tacrolimus  (PROGRAF ) 1 MG capsule Take 3 capsules (3 mg total) by mouth 2 (two) times daily. (Pt stated only send this to [facility] pharmacy) 180 capsule 6   tretinoin  (RETIN-A ) 0.1 % cream Apply topically at bedtime. 45 g 6   HUMULIN N 100 UNIT/ML injection Inject 0.2 mLs (20 Units total) into the skin 2 (two) times daily before a meal. 30 mL 4   insulin  glargine (LANTUS ) 100 UNIT/ML injection INJECT 15 UNITS IN THE MORNING  AND 15 UNITS IN THE EVENING 40 mL 3   insulin  regular (HUMULIN R ) 100 units/mL injection Use with sliding scale up to 4 times a day, maximum 40 units a day 30 mL 4   No current facility-administered medications for this visit.    PHYSICAL EXAM: Vitals:   05/17/24 1611  BP:  124/80  Pulse: 66  Resp: 16  SpO2: 98%  Weight: 159 lb 12.8 oz (72.5 kg)  Height: 5' 6 (1.676 m)   Body mass index is 25.79 kg/m.  Wt Readings from Last 3 Encounters:  05/17/24 159 lb 12.8 oz (72.5 kg)  04/19/24 164 lb (74.4 kg)  02/11/24 162 lb 12.8 oz (73.8 kg)    General: Well developed, well nourished male in no apparent distress.  HEENT: AT/[place], no external lesions.  Eyes: Conjunctiva clear and no icterus. Neck: Neck supple  Lungs: Respirations not labored Neurologic: Alert, oriented, normal speech Extremities / Skin: Dry.  Psychiatric: Does not appear depressed or anxious   Diabetic Foot Exam - Simple   No data filed   LABS Reviewed Lab Results  Component Value Date   HGBA1C 7.0 (A) 05/17/2024   HGBA1C 7.2 (H) 12/09/2023   HGBA1C 6.7 (H) 08/13/2023   No results found for: FRUCTOSAMINE Lab Results  Component Value Date   CHOL 168 12/09/2023   HDL 53 12/09/2023   LDLCALC 83 12/09/2023   TRIG 192 (H) 12/09/2023   CHOLHDL 3.2 12/09/2023   No results found for: MICRALBCREAT Lab Results  Component Value Date   CREATININE 1.25 04/19/2024   No results found for: GFR  ASSESSMENT / PLAN  1. Type 1 diabetes mellitus with other specified complication (HCC)   2. Type 1 diabetes mellitus with complications (HCC)     Diabetes Mellitus type 1, complicated by CKD - Diabetic status / severity: Fair control.  Lab Results  Component Value Date   HGBA1C 7.0 (A) 05/17/2024    - Hemoglobin A1c goal : <7%  Patient was diagnosed with type 1 diabetes mellitus at the age of 14 years.  He has cystic fibrosis status post lung transplant and on immunosuppressive therapy.  He has been on multidose  insulin  regimen including basal insulin  and Humulin NPH and R.  He reports he has been on this regimen insulin  for several years.  He eats high-calorie diet does not restrict calories because of cystic fibrosis.  CGM data as reviewed above.  Postprandial hyperglycemia,  with tendency of hypoglycemia in between the meals.  - Medications: See below.  No change.  I) continue Lantus  15 units 2 times a day. II) continue Humulin N 0 to 20 units 2 times a day. He uses insulin  syringe. III) continue Humulin R  sliding scale as needed, 0 to 20 units up to 4 times a day.  He feels comfortable adjusting dose of N/R insulin .  Patient is advised to take insulin  regular 30 minutes before eating when possible.  Currently has been taking regular insulin  about 10 to 15 minutes before eating.  - Home glucose testing: CGM Dexcom G7 check as needed.  - Discussed/ Gave Hypoglycemia treatment plan.  # Consult : not required at this time.   # Annual urine for microalbuminuria/ creatinine ratio, no microalbuminuria currently.  Will check in the future visit.  Patient reports it is being monitored by primary care provider and used to be normal. Last No results found for: [facility]  # Foot check nightly.  # Annual dilated diabetic eye exams.    2. Blood pressure  -  BP Readings from Last 1 Encounters:  05/17/24 124/80    - Control is in target.  - No change in current plans.  3. Lipid status / Hyperlipidemia - Last  Lab Results  Component Value Date   LDLCALC 83 12/09/2023   - Continue lovastatin  40 mg daily.  Managed by PCP.  Diagnoses and all orders for this visit:  Type 1 diabetes mellitus with other specified complication (HCC) -     POCT glycosylated hemoglobin (Hb A1C) -     insulin  glargine (LANTUS ) 100 UNIT/ML injection; INJECT 15 UNITS IN THE MORNING  AND 15 UNITS IN THE EVENING -     HUMULIN N 100 UNIT/ML injection; Inject 0.2 mLs (20 Units total) into the skin 2 (two) times daily before a meal.  Type 1 diabetes mellitus with complications (HCC) -     insulin  regular (HUMULIN R ) 100 units/mL injection; Use with sliding scale up to 4 times a day, maximum 40 units a day    DISPOSITION Follow up in clinic in 3 months suggested.   All questions  answered and patient verbalized understanding of the plan.  [place] Mirabelle Cyphers, MD [facility] Endocrinology [facility] [address] [place], [address] [place], [place] [address] Phone # [phone]  At least part of this note was generated using voice recognition software. Inadvertent word errors may have occurred, which were not recognized during the proofreading process.

## 2024-05-18 ENCOUNTER — Encounter: Payer: Self-pay | Admitting: Endocrinology

## 2024-05-19 ENCOUNTER — Other Ambulatory Visit: Payer: Self-pay

## 2024-05-19 DIAGNOSIS — E1069 Type 1 diabetes mellitus with other specified complication: Secondary | ICD-10-CM

## 2024-05-19 MED ORDER — AZATHIOPRINE 50 MG PO TABS: 50.0000 mg | ORAL_TABLET | Freq: Every day | ORAL | 3 refills | Status: AC

## 2024-05-19 MED ORDER — DEXCOM G7 SENSOR MISC: 1.0000 | 3 refills | Status: AC

## 2024-05-19 MED ORDER — LOVASTATIN 40 MG PO TABS: 40.0000 mg | ORAL_TABLET | Freq: Every day | ORAL | 3 refills | Status: AC

## 2024-05-19 MED ORDER — ATENOLOL 25 MG PO TABS: 25.0000 mg | ORAL_TABLET | Freq: Every day | ORAL | 3 refills | Status: AC

## 2024-05-20 ENCOUNTER — Telehealth: Payer: Self-pay

## 2024-05-20 NOTE — Telephone Encounter (Signed)
 Pharmacy left message requesting call back to clarify if patient was taking all 3 types of insulins ordered before they would deliver. Called pharmacy back and made them aware patient is. The order was reactivated by Carter/pharmacist and will be sent to patient.

## 2024-08-25 ENCOUNTER — Ambulatory Visit: Payer: Self-pay | Admitting: Internal Medicine

## 2024-08-25 ENCOUNTER — Encounter: Payer: Self-pay | Admitting: Internal Medicine

## 2024-08-25 ENCOUNTER — Ambulatory Visit (INDEPENDENT_AMBULATORY_CARE_PROVIDER_SITE_OTHER): Admitting: Internal Medicine

## 2024-08-25 VITALS — BP 136/76 | HR 76 | Ht 66.0 in | Wt 165.0 lb

## 2024-08-25 DIAGNOSIS — Z942 Lung transplant status: Secondary | ICD-10-CM | POA: Diagnosis not present

## 2024-08-25 DIAGNOSIS — M79604 Pain in right leg: Secondary | ICD-10-CM

## 2024-08-25 DIAGNOSIS — E782 Mixed hyperlipidemia: Secondary | ICD-10-CM

## 2024-08-25 DIAGNOSIS — E1021 Type 1 diabetes mellitus with diabetic nephropathy: Secondary | ICD-10-CM

## 2024-08-25 DIAGNOSIS — I10 Essential (primary) hypertension: Secondary | ICD-10-CM | POA: Diagnosis not present

## 2024-08-25 DIAGNOSIS — M21371 Foot drop, right foot: Secondary | ICD-10-CM | POA: Insufficient documentation

## 2024-08-25 LAB — POCT CBG (FASTING - GLUCOSE)-MANUAL ENTRY: Glucose Fasting, POC: 104 mg/dL — AB (ref 70–99)

## 2024-08-25 MED ORDER — PREDNISONE 5 MG PO TBEC: 3.0000 | DELAYED_RELEASE_TABLET | Freq: Every day | ORAL | 0 refills | Status: DC

## 2024-08-25 MED ORDER — DICLOFENAC SODIUM 1 % EX GEL: 4.0000 g | Freq: Four times a day (QID) | CUTANEOUS | 1 refills | Status: AC

## 2024-08-25 MED ORDER — TACROLIMUS 1 MG PO CAPS: 3.0000 mg | ORAL_CAPSULE | Freq: Two times a day (BID) | ORAL | 11 refills | Status: AC

## 2024-08-25 NOTE — Progress Notes (Signed)
 Established Patient Office Visit  Subjective:  Patient ID: Terrence Brown, male    DOB: 10-Oct-1961  Age: 63 y.o. MRN: [id number]  Chief Complaint  Patient presents with   Follow-up    Patient comes in with complaints of right ankle pain, swelling and tenderness. He has history of foot drop, secondary to stroke and then had surgical correction several years ago, which lasted until now. However his foot is getting more everted and causing significant difficulty with ambulation. He wears compression stockings, has stability wrap , ice and tylenol , but not helping much . Currently he is on Prednisone  15 mg every other day for his Lung transplant, along with Imuran  and Tacrolimus . Pt advised to take 15 mg daily for next 5 days, Voltaren  gel. Also referral to Podiatry. Patient fasting for labs also. PHQ-9/GAD score 11/5. Reports its mostly anger - does not want any medication at this time.    No other concerns at this time.   Past Medical History:  Diagnosis Date   Cystic fibrosis (HCC)    Diabetes mellitus without complication (HCC)    Osteopenia    Sleep apnea    TIA (transient ischemic attack)     Past Surgical History:  Procedure Laterality Date   COLONOSCOPY N/A 11/09/2015   Procedure: COLONOSCOPY;  Surgeon: Donnice Vaughn Manes, MD;  Location: [facility] ENDOSCOPY;  Service: Endoscopy;  Laterality: N/A;   Per office, patient HAS TO be 1st case - IDDM   knee arthoscopy Right    lung transplant Bilateral     Social History   Socioeconomic History   Marital status: Married    Spouse name: Not on file   Number of children: Not on file   Years of education: Not on file   Highest education level: Not on file  Occupational History   Not on file  Tobacco Use   Smoking status: Never   Smokeless tobacco: Never  Substance and Sexual Activity   Alcohol use: No   Drug use: No   Sexual activity: Yes  Other Topics Concern   Not on file  Social History Narrative   Not on file    Social Drivers of Health   Financial Resource Strain: Not on file  Food Insecurity: Not on file  Transportation Needs: Not on file  Physical Activity: Not on file  Stress: Not on file  Social Connections: Not on file  Intimate Partner Violence: Not on file    History reviewed. No pertinent family history.  Allergies  Allergen Reactions   Percocet [Oxycodone-Acetaminophen ]    Sulfa Antibiotics     Outpatient Medications Prior to Visit  Medication Sig   alendronate  (FOSAMAX ) 70 MG tablet Take 1 tablet (70 mg total) by mouth once a week. Take with a full glass of water on an empty stomach.   aspirin  EC 81 MG tablet Take 81 mg by mouth daily.   atenolol  (TENORMIN ) 25 MG tablet Take 1 tablet (25 mg total) by mouth daily.   azaTHIOprine  (IMURAN ) 50 MG tablet Take 1 tablet (50 mg total) by mouth daily.   calcium carbonate (TUMS EX) 750 MG chewable tablet Chew 2 tablets by mouth daily.    Continuous Glucose Sensor (DEXCOM G7 SENSOR) MISC 1 Device by Does not apply route continuous. Change every 10 days.   cyclobenzaprine  (FLEXERIL ) 10 MG tablet Take 1 tablet (10 mg total) by mouth 3 (three) times daily as needed for muscle spasms.   docusate sodium (COLACE) 100 MG capsule Take 100  mg by mouth 2 (two) times daily.   famotidine  (PEPCID ) 20 MG tablet Take 1 tablet (20 mg total) by mouth daily.   ferrous sulfate  325 (65 FE) MG tablet Take 325 mg by mouth daily with breakfast.   Glucagon , rDNA, (GLUCAGON  EMERGENCY) 1 MG KIT To use as needed prn hypoglycemia   Glucose Blood (ONETOUCH ULTRA BLUE VI) by In Vitro route. 7x/daily PRN   HUMULIN  N 100 UNIT/ML injection Inject 0.2 mLs (20 Units total) into the skin 2 (two) times daily before a meal.   insulin  glargine (LANTUS ) 100 UNIT/ML injection INJECT 15 UNITS IN THE MORNING  AND 15 UNITS IN THE EVENING   Insulin  Pen Needle 31G X 8 MM MISC Use as directed to inject insulin  subcutaneously up to 6 times daily.   insulin  regular (HUMULIN  R) 100  units/mL injection Use with sliding scale up to 4 times a day, maximum 40 units a day   Insulin  Syringe-Needle U-100 30G X 5/16 0.3 ML MISC 1 Syringe by Does not apply route every 4 (four) hours.   lovastatin  (MEVACOR ) 40 MG tablet Take 1 tablet (40 mg total) by mouth at bedtime.   magnesium oxide (MAG-OX) 400 MG tablet Take 400 mg by mouth 2 (two) times daily.    Multiple Vitamin (MULTIVITAMIN) capsule Take 1 capsule by mouth daily.   Pancrelipase , Lip-Prot-Amyl, (PANCREAZE ) 4200-14200 units CPEP Take 10-15 capsules by mouth with meals and 0-5 capsules by mouth with snacks.   predniSONE  (DELTASONE ) 5 MG tablet TAKE 3 TABLETS BY MOUTH EVERY  OTHER DAY   tretinoin  (RETIN-A ) 0.1 % cream Apply topically at bedtime.   [DISCONTINUED] tacrolimus  (PROGRAF ) 1 MG capsule Take 3 capsules (3 mg total) by mouth 2 (two) times daily. (Pt stated only send this to [facility])   No facility-administered medications prior to visit.    Review of Systems  Constitutional: Negative.  Negative for chills, fever and malaise/fatigue.  HENT: Negative.  Negative for congestion and sore throat.   Eyes: Negative.  Negative for blurred vision and pain.  Respiratory:  Positive for shortness of breath. Negative for cough, hemoptysis, sputum production and wheezing.   Cardiovascular: Negative.  Negative for chest pain, palpitations and leg swelling.  Gastrointestinal: Negative.  Negative for abdominal pain, blood in stool, constipation, diarrhea, heartburn, melena, nausea and vomiting.  Genitourinary: Negative.  Negative for dysuria, flank pain, frequency and urgency.  Musculoskeletal:  Positive for joint pain (Right ankle). Negative for myalgias.  Skin: Negative.   Neurological: Negative.  Negative for dizziness, tingling, sensory change, weakness and headaches.  Endo/Heme/Allergies: Negative.   Psychiatric/Behavioral: Negative.  Negative for depression and suicidal ideas. The patient is not nervous/anxious.         Objective:   BP 136/76   Pulse 76   Ht 5' 6 (1.676 m)   Wt 165 lb (74.8 kg)   SpO2 97%   BMI 26.63 kg/m   Vitals:   08/25/24 1505  BP: 136/76  Pulse: 76  Height: 5' 6 (1.676 m)  Weight: 165 lb (74.8 kg)  SpO2: 97%  BMI (Calculated): 26.64    Physical Exam Vitals and nursing note reviewed.  Constitutional:      General: He is not in acute distress.    Appearance: Normal appearance. He is not ill-appearing.  HENT:     Head: Normocephalic and atraumatic.     Nose: Nose normal.     Mouth/Throat:     Mouth: Mucous membranes are moist.     Pharynx:  Oropharynx is clear.  Eyes:     Conjunctiva/sclera: Conjunctivae normal.     Pupils: Pupils are equal, round, and reactive to light.  Cardiovascular:     Rate and Rhythm: Normal rate and regular rhythm.     Pulses: Normal pulses.     Heart sounds: Normal heart sounds.  Pulmonary:     Effort: Pulmonary effort is normal.     Breath sounds: Normal breath sounds. No wheezing or rhonchi.  Abdominal:     General: Bowel sounds are normal. There is no distension.     Palpations: Abdomen is soft.     Tenderness: There is no abdominal tenderness.  Musculoskeletal:        General: Deformity present. Normal range of motion.     Cervical back: Normal range of motion and neck supple.     Right lower leg: Edema present.     Left lower leg: No edema.  Skin:    General: Skin is warm and dry.     Capillary Refill: Capillary refill takes less than 2 seconds.  Neurological:     General: No focal deficit present.     Mental Status: He is alert and oriented to person, place, and time.     Sensory: No sensory deficit.     Motor: No weakness.  Psychiatric:        Mood and Affect: Mood normal.        Behavior: Behavior normal.        Judgment: Judgment normal.      Results for orders placed or performed in visit on 08/25/24  POCT CBG (Fasting - Glucose)  Result Value Ref Range   Glucose Fasting, POC 104 (A) 70 - 99 mg/dL     Recent Results (from the past 2160 hours)  POCT CBG (Fasting - Glucose)     Status: Abnormal   Collection Time: 08/25/24  3:15 PM  Result Value Ref Range   Glucose Fasting, POC 104 (A) 70 - 99 mg/dL      Assessment & Plan:  Prednisone   15 mg /d for 5 days , then resume every other day. Podiatry referral. Voltaren  gel and ACE wrap. Labs today. 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   Total time spent: 30 minutes  FERNAND FREDY RAMAN, MD  08/25/2024   This document may have been prepared by [facility] Voice Recognition software and as such may include unintentional dictation errors.

## 2024-08-26 ENCOUNTER — Other Ambulatory Visit: Payer: Self-pay | Admitting: Internal Medicine

## 2024-08-26 DIAGNOSIS — M79604 Pain in right leg: Secondary | ICD-10-CM

## 2024-08-27 LAB — CMP14+EGFR
ALT: 27 IU/L (ref 0–44)
AST: 29 IU/L (ref 0–40)
Albumin: 4.2 g/dL (ref 3.9–4.9)
Alkaline Phosphatase: 62 IU/L (ref 47–123)
BUN/Creatinine Ratio: 9 — ABNORMAL LOW (ref 10–24)
BUN: 14 mg/dL (ref 8–27)
Bilirubin Total: 0.4 mg/dL (ref 0.0–1.2)
CO2: 19 mmol/L — ABNORMAL LOW (ref 20–29)
Calcium: 9.3 mg/dL (ref 8.6–10.2)
Chloride: 100 mmol/L (ref 96–106)
Creatinine, Ser: 1.58 mg/dL — ABNORMAL HIGH (ref 0.76–1.27)
Globulin, Total: 2.7 g/dL (ref 1.5–4.5)
Glucose: 77 mg/dL (ref 70–99)
Potassium: 4.2 mmol/L (ref 3.5–5.2)
Sodium: 137 mmol/L (ref 134–144)
Total Protein: 6.9 g/dL (ref 6.0–8.5)
eGFR: 49 mL/min/1.73 — ABNORMAL LOW (ref >59)

## 2024-08-27 LAB — HEMOGLOBIN A1C
Est. average glucose Bld gHb Est-mCnc: 134 mg/dL
Hgb A1c MFr Bld: 6.3 % — ABNORMAL HIGH (ref 4.8–5.6)

## 2024-08-27 LAB — TACROLIMUS LEVEL: Tacrolimus (FK506), Blood: 11.3 ng/mL (ref 5.0–20.0)

## 2024-08-29 ENCOUNTER — Other Ambulatory Visit: Payer: Self-pay | Admitting: Internal Medicine

## 2024-08-29 DIAGNOSIS — M79604 Pain in right leg: Secondary | ICD-10-CM

## 2024-08-30 NOTE — Progress Notes (Signed)
 Patient notified

## 2024-09-20 ENCOUNTER — Ambulatory Visit: Admitting: Endocrinology

## 2024-09-28 ENCOUNTER — Other Ambulatory Visit: Payer: Self-pay | Admitting: Cardiology

## 2024-09-28 DIAGNOSIS — M810 Age-related osteoporosis without current pathological fracture: Secondary | ICD-10-CM

## 2024-10-18 ENCOUNTER — Other Ambulatory Visit: Payer: Self-pay

## 2024-10-18 ENCOUNTER — Encounter: Payer: Self-pay | Admitting: Internal Medicine

## 2024-10-18 DIAGNOSIS — E119 Type 2 diabetes mellitus without complications: Secondary | ICD-10-CM

## 2024-10-18 MED ORDER — INSULIN SYRINGE-NEEDLE U-100 30G X 5/16" 0.3 ML MISC: 1.0000 | 23 refills | Status: DC

## 2024-10-19 ENCOUNTER — Other Ambulatory Visit: Payer: Self-pay

## 2024-10-19 DIAGNOSIS — E119 Type 2 diabetes mellitus without complications: Secondary | ICD-10-CM

## 2024-10-19 MED ORDER — INSULIN SYRINGE-NEEDLE U-100 30G X 5/16" 0.3 ML MISC: 1.0000 | 23 refills | Status: DC

## 2024-10-20 ENCOUNTER — Ambulatory Visit: Admitting: Internal Medicine

## 2024-10-20 ENCOUNTER — Other Ambulatory Visit: Payer: Self-pay

## 2024-10-20 DIAGNOSIS — E1165 Type 2 diabetes mellitus with hyperglycemia: Secondary | ICD-10-CM

## 2024-10-20 MED ORDER — INSULIN PEN NEEDLE 31G X 8 MM MISC
3 refills | Status: AC
Start: 2024-10-20 — End: ?

## 2024-11-02 ENCOUNTER — Other Ambulatory Visit: Payer: Self-pay

## 2024-11-02 ENCOUNTER — Telehealth: Payer: Self-pay

## 2024-11-02 DIAGNOSIS — E119 Type 2 diabetes mellitus without complications: Secondary | ICD-10-CM

## 2024-11-02 NOTE — Telephone Encounter (Signed)
 Pts rx was sent in for syringes on 10/19/2024 for 1 syringe every 4 hours however pt states he uses up to 7 a day. They need approval before resending the new rx.

## 2024-11-04 MED ORDER — INSULIN SYRINGE-NEEDLE U-100 30G X 5/16" 0.3 ML MISC: 10 refills | Status: AC

## 2024-11-04 NOTE — Addendum Note (Signed)
 Addended byBETHA ORION STABS on: 11/04/2024 08:15 AM   Modules accepted: Orders

## 2024-12-27 ENCOUNTER — Other Ambulatory Visit: Payer: Self-pay

## 2024-12-27 ENCOUNTER — Ambulatory Visit: Payer: Self-pay | Admitting: Internal Medicine

## 2024-12-27 ENCOUNTER — Other Ambulatory Visit

## 2024-12-27 ENCOUNTER — Encounter: Payer: Self-pay | Admitting: Internal Medicine

## 2024-12-27 ENCOUNTER — Ambulatory Visit (INDEPENDENT_AMBULATORY_CARE_PROVIDER_SITE_OTHER): Admitting: Internal Medicine

## 2024-12-27 VITALS — BP 112/78 | HR 68 | Ht 66.0 in | Wt 160.0 lb

## 2024-12-27 DIAGNOSIS — G8929 Other chronic pain: Secondary | ICD-10-CM

## 2024-12-27 DIAGNOSIS — E782 Mixed hyperlipidemia: Secondary | ICD-10-CM

## 2024-12-27 DIAGNOSIS — G4733 Obstructive sleep apnea (adult) (pediatric): Secondary | ICD-10-CM

## 2024-12-27 DIAGNOSIS — Z942 Lung transplant status: Secondary | ICD-10-CM | POA: Diagnosis not present

## 2024-12-27 DIAGNOSIS — E1165 Type 2 diabetes mellitus with hyperglycemia: Secondary | ICD-10-CM

## 2024-12-27 DIAGNOSIS — I10 Essential (primary) hypertension: Secondary | ICD-10-CM

## 2024-12-27 DIAGNOSIS — Z794 Long term (current) use of insulin: Secondary | ICD-10-CM | POA: Diagnosis not present

## 2024-12-27 DIAGNOSIS — Z1211 Encounter for screening for malignant neoplasm of colon: Secondary | ICD-10-CM

## 2024-12-27 DIAGNOSIS — Z0130 Encounter for examination of blood pressure without abnormal findings: Secondary | ICD-10-CM

## 2024-12-27 DIAGNOSIS — E108 Type 1 diabetes mellitus with unspecified complications: Secondary | ICD-10-CM

## 2024-12-27 DIAGNOSIS — N401 Enlarged prostate with lower urinary tract symptoms: Secondary | ICD-10-CM

## 2024-12-27 LAB — POC CREATINE & ALBUMIN,URINE
Albumin/Creatinine Ratio, Urine, POC: <30
Creatinine, POC: 300 mg/dL
Microalbumin Ur, POC: 30 mg/L

## 2024-12-27 LAB — POCT CBG (FASTING - GLUCOSE)-MANUAL ENTRY: Glucose Fasting, POC: 91 mg/dL (ref 70–99)

## 2024-12-27 MED ORDER — GABAPENTIN 100 MG PO CAPS: 100.0000 mg | ORAL_CAPSULE | Freq: Every day | ORAL | 2 refills | Status: AC

## 2024-12-27 MED ORDER — INSULIN REGULAR HUMAN 100 UNIT/ML IJ SOLN: INTRAMUSCULAR | 4 refills | Status: AC

## 2024-12-27 MED ORDER — GLUCOSE BLOOD VI STRP: ORAL_STRIP | 12 refills | Status: AC

## 2024-12-27 NOTE — Progress Notes (Signed)
 "  Established Patient Office Visit  Subjective:  Patient ID: Terrence Brown, male    DOB: [birth year]  Age: 64 y.o. MRN: [id number]  Chief Complaint  Patient presents with   Follow-up    4 month follow up    Patient comes in for his follow-up today accompanied by his wife.  He is generally stable but continues to have his chronic aches and pains, and difficulty sleeping at night.  Patient has OSA and uses a CPAP regularly.  Reports that his sleep is disturbed.  Has history of BPH with lower urinary tract symptoms, previously seen by urologist and tried on Flomax.  It did not work so he stopped using it.  Will check PSA  today and consider urology referral again.   His foot pain has improved with use of Voltaren  gel which he applies to his other joints as well.  So did not go to the podiatrist. Denies chest pain, no chest congestion or cough, no fevers and no chills. He had labs drawn earlier, results pending. Agrees to start gabapentin  at bedtime which will help with sleep and anxiety as well as chronic pain. Patient's last colonoscopy was in 2016, would prefer to get Cologuard this year.    No other concerns at this time.   Past Medical History:  Diagnosis Date   Cystic fibrosis (HCC)    Diabetes mellitus without complication (HCC)    Osteopenia    Sleep apnea    TIA (transient ischemic attack)     Past Surgical History:  Procedure Laterality Date   COLONOSCOPY N/A 11/09/2015   Procedure: COLONOSCOPY;  Surgeon: Donnice Vaughn Manes, MD;  Location: [facility] ENDOSCOPY;  Service: Endoscopy;  Laterality: N/A;   Per office, patient HAS TO be 1st case - IDDM   knee arthoscopy Right    lung transplant Bilateral     Social History   Socioeconomic History   Marital status: Married    Spouse name: Not on file   Number of children: Not on file   Years of education: Not on file   Highest education level: Not on file  Occupational History   Not on file  Tobacco Use   Smoking  status: Never   Smokeless tobacco: Never  Substance and Sexual Activity   Alcohol use: No   Drug use: No   Sexual activity: Yes  Other Topics Concern   Not on file  Social History Narrative   Not on file   Social Drivers of Health   Tobacco Use: Low Risk (12/27/2024)   Patient History    Smoking Tobacco Use: Never    Smokeless Tobacco Use: Never    Passive Exposure: Not on file  Financial Resource Strain: Not on file  Food Insecurity: Not on file  Transportation Needs: Not on file  Physical Activity: Not on file  Stress: Not on file  Social Connections: Not on file  Intimate Partner Violence: Not on file  Depression (PHQ2-9): High Risk (08/25/2024)   Depression (PHQ2-9)    PHQ-2 Score: 11  Alcohol Screen: Not on file  Housing: Not on file  Utilities: Not on file  Health Literacy: Not on file    History reviewed. No pertinent family history.  Allergies[1]  Show/hide medication list[2]  Review of Systems  Constitutional:  Positive for malaise/fatigue. Negative for chills and fever.  HENT: Negative.  Negative for congestion and sore throat.   Eyes: Negative.  Negative for blurred vision and pain.  Respiratory: Negative.  Negative for  cough and shortness of breath.   Cardiovascular: Negative.  Negative for chest pain, palpitations and leg swelling.  Gastrointestinal: Negative.  Negative for abdominal pain, blood in stool, constipation, diarrhea, heartburn, melena, nausea and vomiting.  Genitourinary: Negative.  Negative for dysuria, flank pain, frequency and urgency.  Musculoskeletal:  Positive for back pain and joint pain. Negative for myalgias.  Skin: Negative.   Neurological: Negative.  Negative for dizziness, tingling, sensory change, weakness and headaches.  Endo/Heme/Allergies: Negative.   Psychiatric/Behavioral: Negative.  Negative for depression and suicidal ideas. The patient is not nervous/anxious.        Objective:   BP 112/78   Pulse 68   Ht 5' 6  (1.676 m)   Wt 160 lb (72.6 kg)   SpO2 98%   BMI 25.82 kg/m   Vitals:   12/27/24 1436  BP: 112/78  Pulse: 68  Height: 5' 6 (1.676 m)  Weight: 160 lb (72.6 kg)  SpO2: 98%  BMI (Calculated): 25.84    Physical Exam Vitals and nursing note reviewed.  Constitutional:      General: He is not in acute distress.    Appearance: Normal appearance. He is not ill-appearing.  HENT:     Head: Normocephalic and atraumatic.     Nose: Nose normal.     Mouth/Throat:     Mouth: Mucous membranes are moist.     Pharynx: Oropharynx is clear.  Eyes:     Conjunctiva/sclera: Conjunctivae normal.     Pupils: Pupils are equal, round, and reactive to light.  Cardiovascular:     Rate and Rhythm: Normal rate and regular rhythm.     Pulses: Normal pulses.     Heart sounds: Normal heart sounds.  Pulmonary:     Effort: Pulmonary effort is normal.     Breath sounds: Normal breath sounds. No wheezing or rhonchi.  Abdominal:     General: Bowel sounds are normal. There is no distension.     Palpations: Abdomen is soft.     Tenderness: There is no abdominal tenderness.  Musculoskeletal:        General: Normal range of motion.     Cervical back: Normal range of motion and neck supple.     Right lower leg: No edema.     Left lower leg: No edema.  Skin:    General: Skin is warm and dry.     Capillary Refill: Capillary refill takes less than 2 seconds.  Neurological:     General: No focal deficit present.     Mental Status: He is alert and oriented to person, place, and time.     Sensory: No sensory deficit.     Motor: No weakness.  Psychiatric:        Mood and Affect: Mood normal.        Behavior: Behavior normal.        Judgment: Judgment normal.      Results for orders placed or performed in visit on 12/27/24  POCT CBG (Fasting - Glucose)  Result Value Ref Range   Glucose Fasting, POC 91 70 - 99 mg/dL  POC CREATINE & ALBUMIN,URINE  Result Value Ref Range   Microalbumin Ur, POC 30 mg/L    Creatinine, POC 300 mg/dL   Albumin/Creatinine Ratio, Urine, POC <30     Recent Results (from the past 2160 hours)  POCT CBG (Fasting - Glucose)     Status: None   Collection Time: 12/27/24  2:44 PM  Result Value Ref Range  Glucose Fasting, POC 91 70 - 99 mg/dL  POC CREATINE & ALBUMIN,URINE     Status: Normal   Collection Time: 12/27/24  3:52 PM  Result Value Ref Range   Microalbumin Ur, POC 30 mg/L   Creatinine, POC 300 mg/dL   Albumin/Creatinine Ratio, Urine, POC <30       Assessment & Plan:  Continue current medications.  Check labs.  Start gabapentin  at bedtime.  Problem List Items Addressed This Visit       Respiratory   Cystic fibrosis (HCC)     Endocrine   Type 2 diabetes mellitus with hyperglycemia, with long-term current use of insulin  (HCC) - Primary   Relevant Medications   glucose blood test strip   insulin  regular (HUMULIN  R) 100 units/mL injection   Other Relevant Orders   POCT CBG (Fasting - Glucose) (Completed)   POC CREATINE & ALBUMIN,URINE (Completed)     Other   S/P lung transplant (HCC)   Relevant Orders   Tacrolimus  Level   Other Visit Diagnoses       Type 1 diabetes mellitus with complications (HCC)       Relevant Medications   glucose blood test strip   insulin  regular (HUMULIN  R) 100 units/mL injection     Colon cancer screening       Relevant Orders   Cologuard     OSA on CPAP         Other chronic pain       Relevant Medications   gabapentin  (NEURONTIN ) 100 MG capsule     Benign prostatic hyperplasia with lower urinary tract symptoms, symptom details unspecified       Relevant Orders   PSA       Follow up 3 months.  Total time spent: 30 minutes. This time includes review of previous notes and results and patient face to face interaction during today's visit.    FERNAND FREDY RAMAN, MD  12/27/2024   This document may have been prepared by [facility] Voice Recognition software and as such may include unintentional dictation  errors.     [1]  Allergies Allergen Reactions   Percocet [Oxycodone-Acetaminophen ]    Sulfa Antibiotics   [2]  Outpatient Medications Prior to Visit  Medication Sig   alendronate  (FOSAMAX ) 70 MG tablet TAKE 1 TABLET BY MOUTH WEEKLY  TAKE WITH A FULL GLASS OF WATER  ON AN EMPTY STOMACH   aspirin  EC 81 MG tablet Take 81 mg by mouth daily.   atenolol  (TENORMIN ) 25 MG tablet Take 1 tablet (25 mg total) by mouth daily.   azaTHIOprine  (IMURAN ) 50 MG tablet Take 1 tablet (50 mg total) by mouth daily.   calcium carbonate (TUMS EX) 750 MG chewable tablet Chew 2 tablets by mouth daily.    Continuous Glucose Sensor (DEXCOM G7 SENSOR) MISC 1 Device by Does not apply route continuous. Change every 10 days.   cyclobenzaprine  (FLEXERIL ) 10 MG tablet Take 1 tablet (10 mg total) by mouth 3 (three) times daily as needed for muscle spasms.   diclofenac  Sodium (VOLTAREN ) 1 % GEL Apply 4 g topically 4 (four) times daily.   docusate sodium (COLACE) 100 MG capsule Take 100 mg by mouth 2 (two) times daily.   famotidine  (PEPCID ) 20 MG tablet Take 1 tablet (20 mg total) by mouth daily.   ferrous sulfate  325 (65 FE) MG tablet Take 325 mg by mouth daily with breakfast.   Glucagon , rDNA, (GLUCAGON  EMERGENCY) 1 MG KIT To use as needed prn hypoglycemia  Glucose Blood (ONETOUCH ULTRA BLUE VI) by In Vitro route. 7x/daily PRN   HUMULIN  N 100 UNIT/ML injection Inject 0.2 mLs (20 Units total) into the skin 2 (two) times daily before a meal.   insulin  glargine (LANTUS ) 100 UNIT/ML injection INJECT 15 UNITS IN THE MORNING  AND 15 UNITS IN THE EVENING   Insulin  Pen Needle 31G X 8 MM MISC Use as directed to inject insulin  subcutaneously up to 7 times daily.   Insulin  Syringe-Needle U-100 30G X 5/16 0.3 ML MISC Inject 1 syringe into the skin up to 7 times per day.   lovastatin  (MEVACOR ) 40 MG tablet Take 1 tablet (40 mg total) by mouth at bedtime.   magnesium oxide (MAG-OX) 400 MG tablet Take 400 mg by mouth 2 (two) times  daily.    Multiple Vitamin (MULTIVITAMIN) capsule Take 1 capsule by mouth daily.   Pancrelipase , Lip-Prot-Amyl, (PANCREAZE ) 4200-14200 units CPEP Take 10-15 capsules by mouth with meals and 0-5 capsules by mouth with snacks.   predniSONE  (DELTASONE ) 5 MG tablet TAKE 3 TABLETS BY MOUTH EVERY  OTHER DAY   RAYOS  5 MG TBEC TAKE 3 TABLETS BY MOUTH DAILY FOR 5 DAYS   tacrolimus  (PROGRAF ) 1 MG capsule Take 3 capsules (3 mg total) by mouth 2 (two) times daily.   tretinoin  (RETIN-A ) 0.1 % cream Apply topically at bedtime.   [DISCONTINUED] insulin  regular (HUMULIN  R) 100 units/mL injection Use with sliding scale up to 4 times a day, maximum 40 units a day   No facility-administered medications prior to visit.   "

## 2024-12-28 LAB — LIPID PANEL
Chol/HDL Ratio: 2.3 ratio (ref 0.0–5.0)
Cholesterol, Total: 117 mg/dL (ref 100–199)
HDL: 50 mg/dL
LDL Chol Calc (NIH): 49 mg/dL (ref 0–99)
Triglycerides: 97 mg/dL (ref 0–149)
VLDL Cholesterol Cal: 18 mg/dL (ref 5–40)

## 2024-12-28 LAB — CBC WITH DIFF/PLATELET
Basophils Absolute: 0.1 10*3/uL (ref 0.0–0.2)
Basos: 1 %
EOS (ABSOLUTE): 0.2 10*3/uL (ref 0.0–0.4)
Eos: 3 %
Hematocrit: 41.1 % (ref 37.5–51.0)
Hemoglobin: 13.8 g/dL (ref 13.0–17.7)
Immature Grans (Abs): 0 10*3/uL (ref 0.0–0.1)
Immature Granulocytes: 0 %
Lymphocytes Absolute: 3.2 10*3/uL — ABNORMAL HIGH (ref 0.7–3.1)
Lymphs: 37 %
MCH: 31.8 pg (ref 26.6–33.0)
MCHC: 33.6 g/dL (ref 31.5–35.7)
MCV: 95 fL (ref 79–97)
Monocytes Absolute: 0.9 10*3/uL (ref 0.1–0.9)
Monocytes: 10 %
Neutrophils Absolute: 4.3 10*3/uL (ref 1.4–7.0)
Neutrophils: 49 %
Platelets: 260 10*3/uL (ref 150–450)
RBC: 4.34 x10E6/uL (ref 4.14–5.80)
RDW: 12.1 % (ref 11.6–15.4)
WBC: 8.7 10*3/uL (ref 3.4–10.8)

## 2024-12-28 LAB — HEMOGLOBIN A1C
Est. average glucose Bld gHb Est-mCnc: 151 mg/dL
Hgb A1c MFr Bld: 6.9 % — ABNORMAL HIGH (ref 4.8–5.6)

## 2024-12-29 LAB — COMPREHENSIVE METABOLIC PANEL WITH GFR
ALT: 23 [IU]/L (ref 0–44)
AST: 28 [IU]/L (ref 0–40)
Albumin: 4 g/dL (ref 3.9–4.9)
Alkaline Phosphatase: 69 [IU]/L (ref 47–123)
BUN/Creatinine Ratio: 7 — ABNORMAL LOW (ref 10–24)
BUN: 10 mg/dL (ref 8–27)
Bilirubin Total: 0.5 mg/dL (ref 0.0–1.2)
CO2: 18 mmol/L — ABNORMAL LOW (ref 20–29)
Calcium: 9.1 mg/dL (ref 8.6–10.2)
Chloride: 98 mmol/L (ref 96–106)
Creatinine, Ser: 1.41 mg/dL — ABNORMAL HIGH (ref 0.76–1.27)
Globulin, Total: 2.8 g/dL (ref 1.5–4.5)
Glucose: 82 mg/dL (ref 70–99)
Potassium: 3.7 mmol/L (ref 3.5–5.2)
Sodium: 135 mmol/L (ref 134–144)
Total Protein: 6.8 g/dL (ref 6.0–8.5)
eGFR: 56 mL/min/{1.73_m2} — ABNORMAL LOW

## 2024-12-29 LAB — PSA: Prostate Specific Ag, Serum: 0.6 ng/mL (ref 0.0–4.0)

## 2024-12-29 LAB — SPECIMEN STATUS REPORT

## 2024-12-30 NOTE — Progress Notes (Signed)
 Patient notified

## 2025-01-06 ENCOUNTER — Other Ambulatory Visit: Payer: Self-pay | Admitting: Internal Medicine

## 2025-03-28 ENCOUNTER — Ambulatory Visit: Admitting: Internal Medicine
